# Patient Record
Sex: Male | Born: 1995 | Race: Black or African American | Hispanic: No | Marital: Single
Health system: Southern US, Community
[De-identification: ages and names within clinical notes are randomized; demographics above are authoritative.]

---
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
## 2017-02-20 ENCOUNTER — Encounter (HOSPITAL_COMMUNITY): Payer: Self-pay | Admitting: *Deleted

## 2017-02-20 ENCOUNTER — Other Ambulatory Visit: Payer: Self-pay

## 2017-02-20 DIAGNOSIS — R51 Headache: Secondary | ICD-10-CM | POA: Insufficient documentation

## 2017-02-20 NOTE — ED Triage Notes (Signed)
The pt has had a headache for 4 days with n and v  He has not taken nay medicine for his headache for the past 24 hours

## 2017-02-21 ENCOUNTER — Emergency Department (HOSPITAL_COMMUNITY)
Admission: EM | Admit: 2017-02-21 | Discharge: 2017-02-21 | Disposition: A | Discharge status: Home / Self Care | Payer: Self-pay | Attending: Emergency Medicine | Admitting: Emergency Medicine

## 2017-02-21 DIAGNOSIS — R519 Headache, unspecified: Secondary | ICD-10-CM

## 2017-02-21 DIAGNOSIS — R51 Headache: Secondary | ICD-10-CM

## 2017-02-21 MED ORDER — KETOROLAC TROMETHAMINE 60 MG/2ML IM SOLN
60.0000 mg | Freq: Once | INTRAMUSCULAR | Status: AC
  Administered 2017-02-21: 60 mg via INTRAMUSCULAR
  Filled 2017-02-21: qty 2

## 2017-02-21 NOTE — ED Provider Notes (Signed)
[facility] Provider Note   CSN: [id number] Arrival date & time: 02/20/17  2252     History   Chief Complaint Chief Complaint  Patient presents with  . Headache    HPI Tymel Randa Evensdwards is a 21 y.o. male.  The history is provided by the patient and medical records.  Headache      21 year old male presenting to the ED with headache.  Reports he has had diffuse, throbbing headache for the past 2 days.  He reports associated nausea but denies vomiting.  States he has been somewhat sensitive to light.  He denies any dizziness, numbness, weakness, confusion, changes in speech, or trouble walking.  He has no known history of migraines.  He denies any falls or head trauma.  Not currently on anticoagulation.  He has not taken any medications for his symptoms prior to arrival.  History reviewed. No pertinent past medical history.  There are no active problems to display for this patient.   History reviewed. No pertinent surgical history.     Home Medications    Prior to Admission medications   Medication Sig Start Date End Date Taking? Authorizing Provider  acetaminophen (TYLENOL) 325 MG tablet Take 650 mg by mouth every 6 (six) hours as needed for headache.     [provider]  ibuprofen (ADVIL,MOTRIN) 200 MG tablet Take 200 mg by mouth every 6 (six) hours as needed for headache.     [provider]  ondansetron (ZOFRAN ODT) 8 MG disintegrating tablet Take 1 tablet (8 mg total) by mouth every 8 (eight) hours as needed for nausea or vomiting. 05/23/15   Azalia Bilisampos, Kevin, MD    Family History No family history on file.  Social History Social History   Tobacco Use  . Smoking status: Never Smoker  . Smokeless tobacco: Never Used  Substance Use Topics  . Alcohol use: No  . Drug use: No     Allergies   Patient has no known allergies.   Review of Systems Review of Systems  Neurological: Positive for headaches.  All  other systems reviewed and are negative.    Physical Exam Updated Vital Signs BP 124/75   Pulse 83   Temp 97.7 F (36.5 C) (Oral)   Resp 16   Ht 5\' 11"  (1.803 m)   Wt 86.2 kg (190 lb)   SpO2 94%   BMI 26.50 kg/m   Physical Exam  Constitutional: He is oriented to person, place, and time. He appears well-developed and well-nourished. No distress.  HENT:  Head: Normocephalic and atraumatic.  Right Ear: External ear normal.  Left Ear: External ear normal.  Eyes: Conjunctivae and EOM are normal. Pupils are equal, round, and reactive to light.  Neck: Normal range of motion and full passive range of motion without pain. Neck supple. No neck rigidity.  No rigidity, no meningismus  Cardiovascular: Normal rate, regular rhythm and normal heart sounds.  No murmur heard. Pulmonary/Chest: Effort normal and breath sounds normal. No respiratory distress. He has no wheezes. He has no rhonchi.  Abdominal: Soft. Bowel sounds are normal. There is no tenderness. There is no guarding.  Musculoskeletal: Normal range of motion. He exhibits no edema.  Neurological: He is alert and oriented to person, place, and time. He has normal strength. He displays no tremor. No cranial nerve deficit or sensory deficit. He displays no seizure activity.  AAOx3, answering questions and following commands appropriately; equal strength UE and LE bilaterally; CN grossly intact;  moves all extremities appropriately without ataxia; no focal neuro deficits or facial asymmetry appreciated  Skin: Skin is warm and dry. No rash noted. He is not diaphoretic.  Psychiatric: He has a normal mood and affect. His behavior is normal. Thought content normal.  Nursing note and vitals reviewed.    ED Treatments / Results  Labs (all labs ordered are listed, but only abnormal results are displayed) Labs Reviewed - No data to display  EKG  EKG Interpretation None       Radiology No results found.  Procedures Procedures  (including critical care time)  Medications Ordered in ED Medications  ketorolac (TORADOL) injection 60 mg (60 mg Intramuscular Given 02/21/17 0256)     Initial Impression / Assessment and Plan / ED Course  I have reviewed the triage vital signs and the nursing notes.  Pertinent labs & imaging results that were available during my care of the patient were reviewed by me and considered in my medical decision making (see chart for details).  21 year old male here with headache.  Diffuse in nature with associated photophobia and nausea.  No vomiting, fever, chills, neck pain.  Patient AAOx3 here.  NAD.  Neurologic exam non-focal.  No signs/symptoms concerning for meningitis.  Will give dose of IM toradol.  Headache 2/10 after toradol.  Has been resting comfortably.  Remains neurologically intact.  Patient seems appropriate for discharge.  Close follow-up with PCP encouraged.  Discussed plan with patient, he acknowledged understanding and agreed with plan of care.  Return precautions given for new or worsening symptoms.  Final Clinical Impressions(s) / ED Diagnoses   Final diagnoses:  Bad headache    ED Discharge Orders    None       Garlon HatchetSanders, Lisa M, PA-C 02/21/17 0504    Azalia Bilisampos, Kevin, MD 02/21/17 [phone]

## 2017-02-21 NOTE — Discharge Instructions (Signed)
Follow-up with your primary care doctor. Return here for any new/worsening symptoms.

## 2017-03-17 ENCOUNTER — Encounter (HOSPITAL_COMMUNITY): Payer: Self-pay

## 2017-03-17 ENCOUNTER — Ambulatory Visit (HOSPITAL_COMMUNITY)
Admission: EM | Admit: 2017-03-17 | Discharge: 2017-03-17 | Disposition: A | Discharge status: Home / Self Care | Payer: Self-pay | Attending: Family Medicine | Admitting: Family Medicine

## 2017-03-17 ENCOUNTER — Other Ambulatory Visit: Payer: Self-pay

## 2017-03-17 DIAGNOSIS — R112 Nausea with vomiting, unspecified: Secondary | ICD-10-CM

## 2017-03-17 MED ORDER — ONDANSETRON 8 MG PO TBDP: 8.0000 mg | ORAL_TABLET | Freq: Three times a day (TID) | ORAL | 0 refills | Status: AC | PRN

## 2017-03-17 NOTE — Discharge Instructions (Signed)
Please do your best to ensure adequate fluid intake in order to avoid dehydration. If you find that you are unable to tolerate drinking fluids regularly please proceed to the Emergency Department for evaluation. ° ° °

## 2017-03-17 NOTE — ED Triage Notes (Signed)
[facility], pt states has vomited at least 5 times and vomit is blue in appearance along with blood, pt has not taken any medication

## 2017-03-22 NOTE — ED Provider Notes (Signed)
  [facility]   [id number] 03/17/17 Arrival Time: 1746  ASSESSMENT & PLAN:  1. Non-intractable vomiting with nausea, unspecified vomiting type     Meds ordered this encounter  Medications  . ondansetron (ZOFRAN ODT) 8 MG disintegrating tablet    Sig: Take 1 tablet (8 mg total) by mouth every 8 (eight) hours as needed for nausea or vomiting.    Dispense:  12 tablet    Refill:  0   Likely early viral illness. Close observation. Will f/u if worsening or unable to tolerate PO intake.  Reviewed expectations re: course of current medical issues. Questions answered. Outlined signs and symptoms indicating need for more acute intervention. Patient verbalized understanding. After Visit Summary given.   SUBJECTIVE:  Ronald Pickerelndrell Ciolino is a 22 y.o. male who presents with complaint of emesis. Abrupt onset early this morning. Decreased PO intake. No hematemesis. Afebrile. No diarrhea. No OTC treatment. Last bowel movement yesterday without blood. No sick contacts. No recent travel. No abdominal pain. History reviewed. No pertinent surgical history.  ROS: As per HPI.  OBJECTIVE:  Vitals:   03/17/17 1844  BP: (!) 121/104  Pulse: 97  Resp: 17  Temp: 98.2 F (36.8 C)  TempSrc: Oral  SpO2: 99%    General appearance: alert; no distress Lungs: clear to auscultation bilaterally Heart: regular rate and rhythm Abdomen: soft; non-distended; no tenderness; bowel sounds present; no masses or organomegaly; no guarding or rebound tenderness Back: no CVA tenderness; FROM at hips Extremities: no edema; symmetrical with no gross deformities Skin: warm and dry Psychological: alert and cooperative; normal mood and affect   No Known Allergies                                              Social History   Socioeconomic History  . Marital status: Single    Spouse name: Not on file  . Number of children: Not on file  . Years of education: Not on file  . Highest education level: Not on  file  Social Needs  . Financial resource strain: Not on file  . Food insecurity - worry: Not on file  . Food insecurity - inability: Not on file  . Transportation needs - medical: Not on file  . Transportation needs - non-medical: Not on file  Occupational History  . Not on file  Tobacco Use  . Smoking status: Never Smoker  . Smokeless tobacco: Never Used  Substance and Sexual Activity  . Alcohol use: No  . Drug use: No  . Sexual activity: Not on file  Other Topics Concern  . Not on file  Social History Narrative  . Not on file      Mardella LaymanHagler, Omer Puccinelli, MD 03/22/17 [phone]

## 2017-10-12 IMAGING — DX DG CHEST 2V
2 series · 2 of 2 positions shown · non-contrast
Comparison: None.

CLINICAL DATA: Fever

EXAM:
CHEST  2 VIEW

[chest pa]
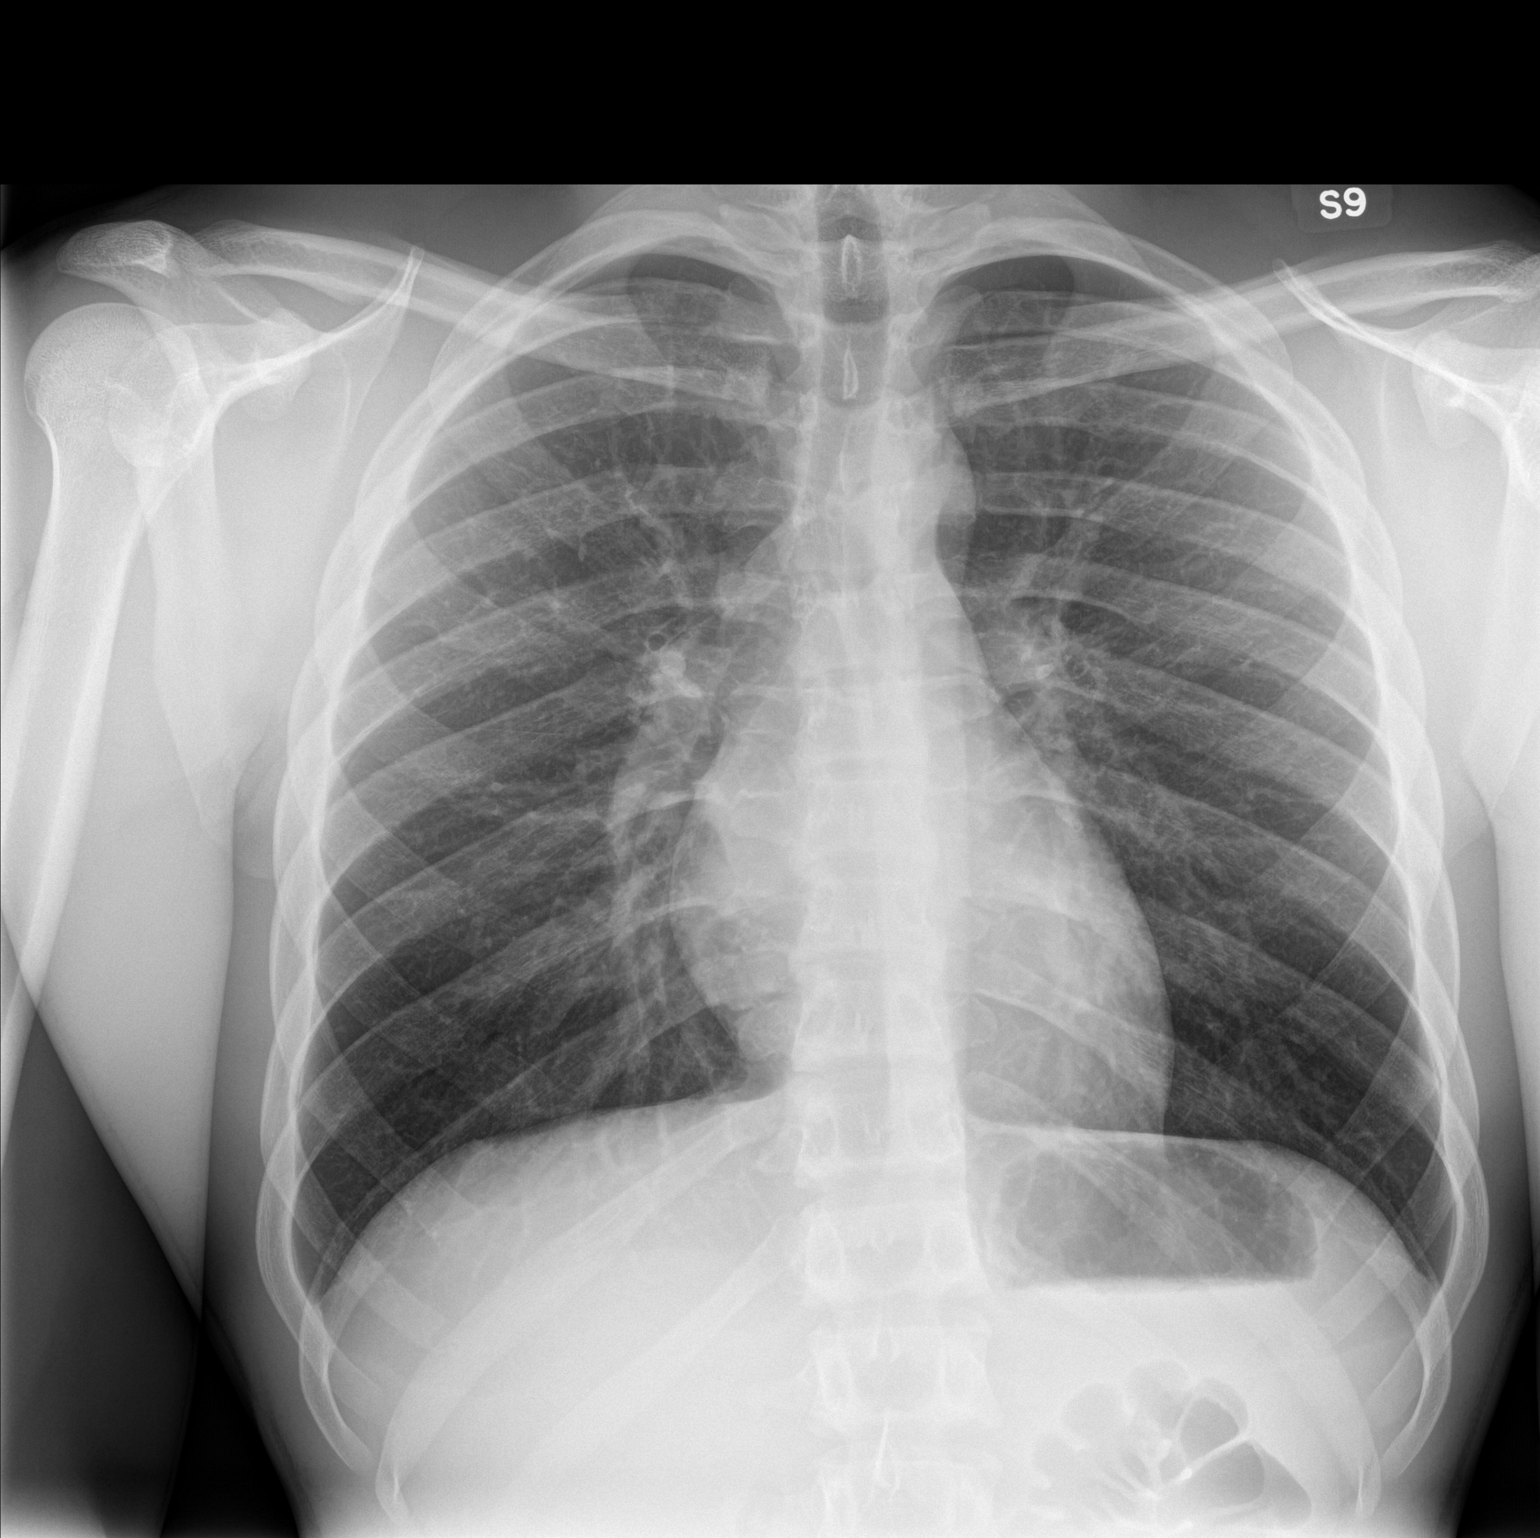

[chest lat]
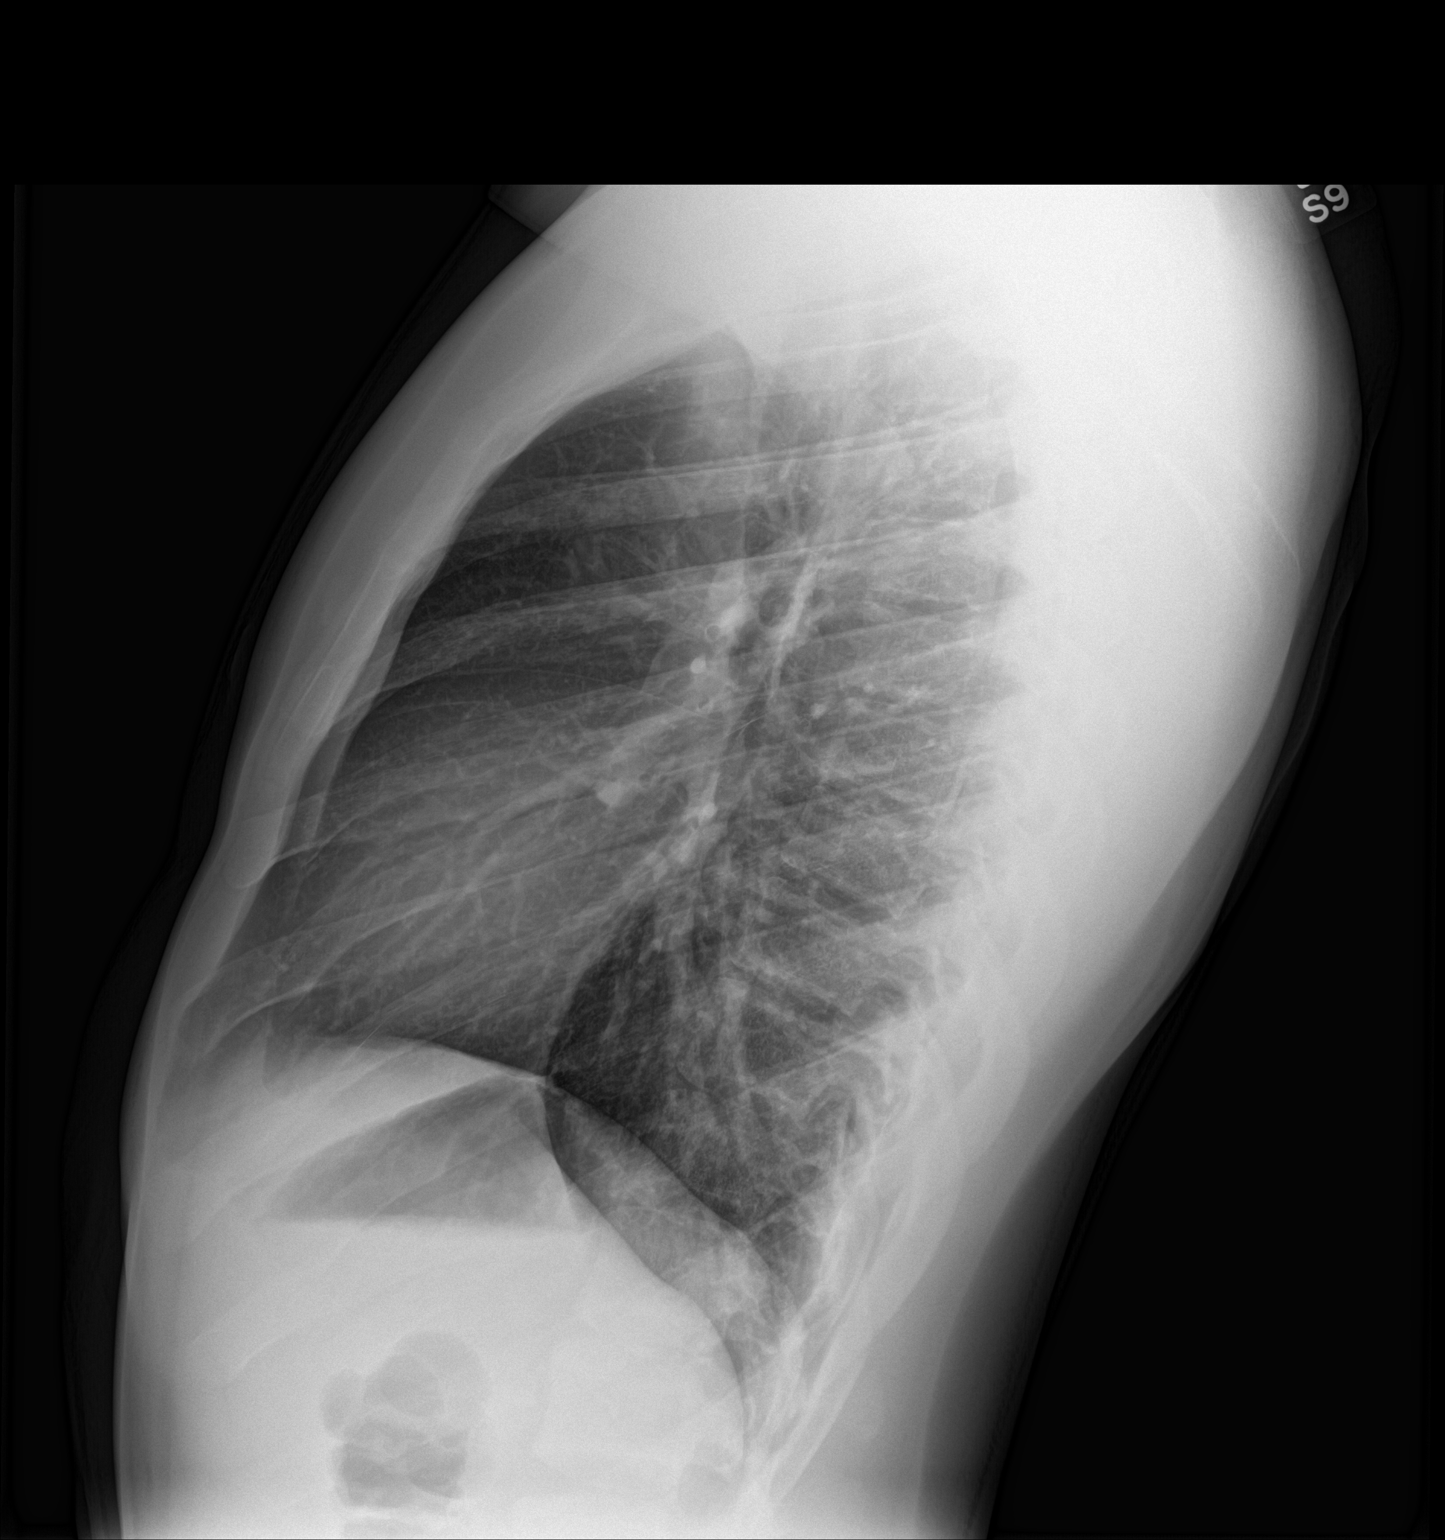

[2 of 2 positions shown; findings below may reference images not displayed]

FINDINGS: Normal heart size. Normal mediastinal contour. No pneumothorax. No
pleural effusion. Lungs appear clear, with no acute consolidative
airspace disease and no pulmonary edema.
IMPRESSION: No active cardiopulmonary disease.

## 2023-06-03 ENCOUNTER — Ambulatory Visit (HOSPITAL_COMMUNITY)
Admission: EM | Admit: 2023-06-03 | Discharge: 2023-06-03 | Disposition: A | Discharge status: Home / Self Care | Attending: Emergency Medicine | Admitting: Emergency Medicine

## 2023-06-03 ENCOUNTER — Encounter (HOSPITAL_COMMUNITY): Payer: Self-pay | Admitting: *Deleted

## 2023-06-03 DIAGNOSIS — Z202 Contact with and (suspected) exposure to infections with a predominantly sexual mode of transmission: Secondary | ICD-10-CM | POA: Insufficient documentation

## 2023-06-03 LAB — HIV ANTIBODY (ROUTINE TESTING W REFLEX): HIV Screen 4th Generation wRfx: NONREACTIVE

## 2023-06-03 MED ORDER — CEFTRIAXONE SODIUM 500 MG IJ SOLR
500.0000 mg | INTRAMUSCULAR | Status: DC
  Administered 2023-06-03: 500 mg via INTRAMUSCULAR

## 2023-06-03 MED ORDER — LIDOCAINE HCL (PF) 1 % IJ SOLN
INTRAMUSCULAR | Status: AC
  Filled 2023-06-03: qty 2

## 2023-06-03 MED ORDER — CEFTRIAXONE SODIUM 500 MG IJ SOLR
INTRAMUSCULAR | Status: AC
  Filled 2023-06-03: qty 500

## 2023-06-03 NOTE — ED Provider Notes (Signed)
 [facility]    CSN: [id number] Arrival date & time: 06/03/23  8295      History   Chief Complaint Chief Complaint  Patient presents with   SEXUALLY TRANSMITTED DISEASE    HPI Ronald Price is a 28 y.o. male.   Presents to clinic requesting sexually-transmitted infection screening.  He was notified this morning that a sexual partner tested positive for gonorrhea.  He was last sexually active with this partner 3 days ago and did not use a condom or barrier method.  He has never had an STI before and denies any symptoms or other known exposures.  Denies penile discharge, dysuria or penile discomfort.  Denies any penile sores or lesions.  The history is provided by the patient and medical records.    History reviewed. No pertinent past medical history.  There are no active problems to display for this patient.   History reviewed. No pertinent surgical history.     Home Medications    Prior to Admission medications   Medication Sig Start Date End Date Taking? Authorizing Provider  acetaminophen (TYLENOL) 325 MG tablet Take 650 mg by mouth every 6 (six) hours as needed for headache.     [provider]  ibuprofen (ADVIL,MOTRIN) 200 MG tablet Take 200 mg by mouth every 6 (six) hours as needed for headache.     [provider]  ondansetron (ZOFRAN ODT) 8 MG disintegrating tablet Take 1 tablet (8 mg total) by mouth every 8 (eight) hours as needed for nausea or vomiting. 03/17/17   Mardella Layman, MD    Family History History reviewed. No pertinent family history.  Social History Social History   Tobacco Use   Smoking status: Never   Smokeless tobacco: Never  Vaping Use   Vaping status: Never Used  Substance Use Topics   Alcohol use: No   Drug use: No     Allergies   Patient has no known allergies.   Review of Systems Review of Systems  Per HPI  Physical Exam Triage Vital Signs ED Triage Vitals  Encounter Vitals Group     BP  06/03/23 0937 123/69     Systolic BP Percentile --      Diastolic BP Percentile --      Pulse Rate 06/03/23 0937 67     Resp 06/03/23 0937 18     Temp 06/03/23 0937 (!) 97.4 F (36.3 C)     Temp Source 06/03/23 0937 Oral     SpO2 06/03/23 0937 96 %     Weight --      Height --      Head Circumference --      Peak Flow --      Pain Score 06/03/23 0936 0     Pain Loc --      Pain Education --      Exclude from Growth Chart --    No data found.  Updated Vital Signs BP 123/69 (BP Location: Left Arm)   Pulse 67   Temp (!) 97.4 F (36.3 C) (Oral)   Resp 18   SpO2 96%   Visual Acuity Right Eye Distance:   Left Eye Distance:   Bilateral Distance:    Right Eye Near:   Left Eye Near:    Bilateral Near:     Physical Exam Vitals and nursing note reviewed.  Constitutional:      Appearance: Normal appearance.  HENT:     Head: Normocephalic and atraumatic.  Right Ear: External ear normal.     Left Ear: External ear normal.     Nose: Nose normal.     Mouth/Throat:     Mouth: Mucous membranes are moist.  Eyes:     Conjunctiva/sclera: Conjunctivae normal.  Cardiovascular:     Rate and Rhythm: Normal rate.  Pulmonary:     Effort: Pulmonary effort is normal. No respiratory distress.  Neurological:     General: No focal deficit present.     Mental Status: He is alert.  Psychiatric:        Mood and Affect: Mood normal.      UC Treatments / Results  Labs (all labs ordered are listed, but only abnormal results are displayed) Labs Reviewed  RPR  HIV ANTIBODY (ROUTINE TESTING W REFLEX)  CYTOLOGY, (ORAL, ANAL, URETHRAL) ANCILLARY ONLY    EKG   Radiology No results found.  Procedures Procedures (including critical care time)  Medications Ordered in UC Medications  cefTRIAXone (ROCEPHIN) injection 500 mg (has no administration in time range)    Initial Impression / Assessment and Plan / UC Course  I have reviewed the triage vital signs and the nursing  notes.  Pertinent labs & imaging results that were available during my care of the patient were reviewed by me and considered in my medical decision making (see chart for details).  Vitals and triage reviewed, patient is hemodynamically stable.  Asymptomatic STI screening, would like HIV and syphilis screening as well.  Exposure to gonorrhea 3 days ago, will treat empirically in clinic with IM Rocephin.  Staff will contact if follow-up or additional treatment is needed.  Plan of care, follow-up care return precautions given, no questions at this time.    Final Clinical Impressions(s) / UC Diagnoses   Final diagnoses:  Exposure to sexually transmitted disease (STD)     Discharge Instructions      Due to your exposure to gonorrhea, we have treated you in clinic with a intramuscular injection of antibiotics.  Abstain from intercourse for the next 7 days and until all results from sexually-transmitted infection screening have been received.  Our staff will contact you if any additional treatment is required.  Return to clinic for any new or urgent symptoms.     ED Prescriptions   None    PDMP not reviewed this encounter.   Davarion Cuffee, [place], [place] 06/03/23 1000

## 2023-06-03 NOTE — Discharge Instructions (Addendum)
 Due to your exposure to gonorrhea, we have treated you in clinic with a intramuscular injection of antibiotics.  Abstain from intercourse for the next 7 days and until all results from sexually-transmitted infection screening have been received.  Our staff will contact you if any additional treatment is required.  Return to clinic for any new or urgent symptoms.

## 2023-06-03 NOTE — ED Triage Notes (Signed)
 Pt states he would like to be tested for STI his partner tested positive for gonorrhea. He denies any sx. He would like cyto and blood work.

## 2023-06-04 LAB — RPR: RPR Ser Ql: NONREACTIVE

## 2023-06-06 LAB — CYTOLOGY, (ORAL, ANAL, URETHRAL) ANCILLARY ONLY
Chlamydia: NEGATIVE
Comment: NEGATIVE
Comment: NEGATIVE
Comment: NORMAL
Neisseria Gonorrhea: NEGATIVE
Trichomonas: NEGATIVE

## 2023-08-29 ENCOUNTER — Emergency Department (HOSPITAL_COMMUNITY): Payer: Self-pay | Admitting: Certified Registered Nurse Anesthetist

## 2023-08-29 ENCOUNTER — Inpatient Hospital Stay (HOSPITAL_COMMUNITY)
Admission: EM | Admit: 2023-08-29 | Discharge: 2023-09-06 | DRG: 957 | Disposition: A | Discharge status: Home / Self Care | Payer: Self-pay | Attending: General Surgery | Admitting: General Surgery

## 2023-08-29 ENCOUNTER — Encounter (HOSPITAL_COMMUNITY): Admission: EM | Disposition: A | Discharge status: Home / Self Care | Payer: Self-pay | Source: Home / Self Care

## 2023-08-29 ENCOUNTER — Other Ambulatory Visit: Payer: Self-pay

## 2023-08-29 ENCOUNTER — Emergency Department (HOSPITAL_COMMUNITY): Payer: Self-pay

## 2023-08-29 DIAGNOSIS — K567 Ileus, unspecified: Secondary | ICD-10-CM | POA: Diagnosis not present

## 2023-08-29 DIAGNOSIS — Z23 Encounter for immunization: Secondary | ICD-10-CM

## 2023-08-29 DIAGNOSIS — D62 Acute posthemorrhagic anemia: Secondary | ICD-10-CM | POA: Diagnosis present

## 2023-08-29 DIAGNOSIS — Z9889 Other specified postprocedural states: Principal | ICD-10-CM

## 2023-08-29 DIAGNOSIS — S36116A Major laceration of liver, initial encounter: Secondary | ICD-10-CM | POA: Diagnosis present

## 2023-08-29 DIAGNOSIS — D696 Thrombocytopenia, unspecified: Secondary | ICD-10-CM | POA: Diagnosis present

## 2023-08-29 DIAGNOSIS — S36113A Laceration of liver, unspecified degree, initial encounter: Secondary | ICD-10-CM

## 2023-08-29 DIAGNOSIS — S27808A Other injury of diaphragm, initial encounter: Secondary | ICD-10-CM | POA: Diagnosis present

## 2023-08-29 DIAGNOSIS — E872 Acidosis, unspecified: Secondary | ICD-10-CM | POA: Diagnosis present

## 2023-08-29 DIAGNOSIS — S27331A Laceration of lung, unilateral, initial encounter: Secondary | ICD-10-CM | POA: Diagnosis present

## 2023-08-29 DIAGNOSIS — J942 Hemothorax: Secondary | ICD-10-CM

## 2023-08-29 DIAGNOSIS — R578 Other shock: Secondary | ICD-10-CM | POA: Diagnosis present

## 2023-08-29 DIAGNOSIS — S21441A Puncture wound with foreign body of right back wall of thorax with penetration into thoracic cavity, initial encounter: Secondary | ICD-10-CM | POA: Diagnosis present

## 2023-08-29 DIAGNOSIS — S51831A Puncture wound without foreign body of right forearm, initial encounter: Secondary | ICD-10-CM | POA: Diagnosis present

## 2023-08-29 DIAGNOSIS — E876 Hypokalemia: Secondary | ICD-10-CM | POA: Diagnosis not present

## 2023-08-29 DIAGNOSIS — S2231XB Fracture of one rib, right side, initial encounter for open fracture: Secondary | ICD-10-CM | POA: Diagnosis present

## 2023-08-29 DIAGNOSIS — J9601 Acute respiratory failure with hypoxia: Secondary | ICD-10-CM | POA: Diagnosis present

## 2023-08-29 DIAGNOSIS — S27321A Contusion of lung, unilateral, initial encounter: Secondary | ICD-10-CM | POA: Diagnosis present

## 2023-08-29 DIAGNOSIS — S31139A Puncture wound of abdominal wall without foreign body, unspecified quadrant without penetration into peritoneal cavity, initial encounter: Secondary | ICD-10-CM

## 2023-08-29 DIAGNOSIS — S27809A Unspecified injury of diaphragm, initial encounter: Secondary | ICD-10-CM | POA: Diagnosis not present

## 2023-08-29 DIAGNOSIS — W3400XA Accidental discharge from unspecified firearms or gun, initial encounter: Secondary | ICD-10-CM | POA: Diagnosis not present

## 2023-08-29 DIAGNOSIS — S31630A Puncture wound without foreign body of abdominal wall, right upper quadrant with penetration into peritoneal cavity, initial encounter: Secondary | ICD-10-CM | POA: Diagnosis present

## 2023-08-29 DIAGNOSIS — S271XXA Traumatic hemothorax, initial encounter: Secondary | ICD-10-CM | POA: Diagnosis present

## 2023-08-29 DIAGNOSIS — S31130A Puncture wound of abdominal wall without foreign body, right upper quadrant without penetration into peritoneal cavity, initial encounter: Secondary | ICD-10-CM | POA: Diagnosis present

## 2023-08-29 HISTORY — PX: LIVER REPAIR: SHX6734

## 2023-08-29 HISTORY — PX: LAPAROTOMY: SHX154

## 2023-08-29 HISTORY — PX: CLOSURE OF DIAPHRAGM: SHX5777

## 2023-08-29 LAB — I-STAT VENOUS BLOOD GAS, ED
Acid-base deficit: 20 mmol/L — ABNORMAL HIGH (ref 0.0–2.0)
Bicarbonate: 7.8 mmol/L — ABNORMAL LOW (ref 20.0–28.0)
Calcium, Ion: 1 mmol/L — ABNORMAL LOW (ref 1.15–1.40)
HCT: 41 % (ref 39.0–52.0)
Hemoglobin: 13.9 g/dL (ref 13.0–17.0)
O2 Saturation: 99 %
Potassium: 4.1 mmol/L (ref 3.5–5.1)
Sodium: 145 mmol/L (ref 135–145)
TCO2: 9 mmol/L — ABNORMAL LOW (ref 22–32)
pCO2, Ven: 24.8 mmHg — ABNORMAL LOW (ref 44–60)
pH, Ven: 7.106 — CL (ref 7.25–7.43)
pO2, Ven: 170 mmHg — ABNORMAL HIGH (ref 32–45)

## 2023-08-29 LAB — I-STAT CG4 LACTIC ACID, ED: Lactic Acid, Venous: >15 mmol/L (ref 0.5–1.9)

## 2023-08-29 LAB — MASSIVE TRANSFUSION PROTOCOL ORDER (BLOOD BANK NOTIFICATION)

## 2023-08-29 LAB — DIC (DISSEMINATED INTRAVASCULAR COAGULATION)PANEL
D-Dimer, Quant: 3.98 ug{FEU}/mL — ABNORMAL HIGH (ref 0.00–0.50)
Fibrinogen: 188 mg/dL — ABNORMAL LOW (ref 210–475)
INR: 1.4 — ABNORMAL HIGH (ref 0.8–1.2)
Platelets: 150 10*3/uL (ref 150–400)
Prothrombin Time: 17.5 s — ABNORMAL HIGH (ref 11.4–15.2)
Smear Review: NONE SEEN
aPTT: 29 s (ref 24–36)

## 2023-08-29 LAB — HEMOGLOBIN AND HEMATOCRIT, BLOOD
HCT: 35.9 % — ABNORMAL LOW (ref 39.0–52.0)
Hemoglobin: 11.5 g/dL — ABNORMAL LOW (ref 13.0–17.0)

## 2023-08-29 SURGERY — LAPAROTOMY, EXPLORATORY
Anesthesia: General | Site: Abdomen

## 2023-08-29 MED ORDER — SODIUM CHLORIDE 0.9 % IV SOLN
INTRAVENOUS | Status: DC | PRN
Start: 2023-08-29 — End: 2023-08-30

## 2023-08-29 MED ORDER — FENTANYL CITRATE (PF) 250 MCG/5ML IJ SOLN
INTRAMUSCULAR | Status: AC
Start: 2023-08-29 — End: 2023-08-29
  Filled 2023-08-29: qty 5

## 2023-08-29 MED ORDER — KETAMINE HCL 50 MG/5ML IJ SOSY
PREFILLED_SYRINGE | INTRAMUSCULAR | Status: AC
  Filled 2023-08-29: qty 5

## 2023-08-29 MED ORDER — SUCCINYLCHOLINE CHLORIDE 200 MG/10ML IV SOSY
PREFILLED_SYRINGE | INTRAVENOUS | Status: DC | PRN
  Administered 2023-08-29: 140 mg via INTRAVENOUS

## 2023-08-29 MED ORDER — 0.9 % SODIUM CHLORIDE (POUR BTL) OPTIME
TOPICAL | Status: DC | PRN
  Administered 2023-08-29: 4000 mL

## 2023-08-29 MED ORDER — TETANUS-DIPHTH-ACELL PERTUSSIS 5-2.5-18.5 LF-MCG/0.5 IM SUSY
0.5000 mL | PREFILLED_SYRINGE | Freq: Once | INTRAMUSCULAR | Status: DC
  Administered 2023-08-29: 0.5 mL via INTRAMUSCULAR

## 2023-08-29 MED ORDER — TRANEXAMIC ACID-NACL 1000-0.7 MG/100ML-% IV SOLN: 1000.0000 mg | Freq: Once | INTRAVENOUS | Status: DC

## 2023-08-29 MED ORDER — MIDAZOLAM HCL 2 MG/2ML IJ SOLN
INTRAMUSCULAR | Status: AC
  Filled 2023-08-29: qty 2

## 2023-08-29 MED ORDER — LIDOCAINE-EPINEPHRINE (PF) 2 %-1:200000 IJ SOLN
INTRAMUSCULAR | Status: AC
  Filled 2023-08-29: qty 20

## 2023-08-29 MED ORDER — ALBUMIN HUMAN 5 % IV SOLN: INTRAVENOUS | Status: DC | PRN

## 2023-08-29 MED ORDER — CEFAZOLIN SODIUM-DEXTROSE 2-4 GM/100ML-% IV SOLN
2.0000 g | Freq: Once | INTRAVENOUS | Status: AC
  Administered 2023-08-29: 2 g via INTRAVENOUS

## 2023-08-29 MED ORDER — FENTANYL CITRATE PF 50 MCG/ML IJ SOSY
PREFILLED_SYRINGE | INTRAMUSCULAR | Status: AC
  Administered 2023-08-29: 100 ug
  Filled 2023-08-29: qty 2

## 2023-08-29 MED ORDER — PHENYLEPHRINE HCL-NACL 20-0.9 MG/250ML-% IV SOLN: INTRAVENOUS | Status: DC | PRN

## 2023-08-29 MED ORDER — MIDAZOLAM HCL 2 MG/2ML IJ SOLN
INTRAMUSCULAR | Status: DC | PRN
  Administered 2023-08-29: 2 mg via INTRAVENOUS

## 2023-08-29 MED ORDER — PROPOFOL 10 MG/ML IV BOLUS
INTRAVENOUS | Status: DC | PRN
  Administered 2023-08-29: 150 mg via INTRAVENOUS

## 2023-08-29 MED ORDER — PHENYLEPHRINE HCL-NACL 20-0.9 MG/250ML-% IV SOLN
INTRAVENOUS | Status: DC | PRN
  Administered 2023-08-29: 50 ug/min via INTRAVENOUS

## 2023-08-29 MED ORDER — ROCURONIUM BROMIDE 10 MG/ML (PF) SYRINGE
PREFILLED_SYRINGE | INTRAVENOUS | Status: DC | PRN
  Administered 2023-08-29: 100 mg via INTRAVENOUS
  Administered 2023-08-29: 50 mg via INTRAVENOUS

## 2023-08-29 MED ORDER — TRANEXAMIC ACID 1000 MG/10ML IV SOLN
1000.0000 mg | Freq: Once | INTRAVENOUS | Status: AC
  Administered 2023-08-29: 1000 mg via INTRAVENOUS
  Filled 2023-08-29: qty 10

## 2023-08-29 MED ORDER — KETAMINE HCL 10 MG/ML IJ SOLN
INTRAMUSCULAR | Status: DC | PRN
  Administered 2023-08-29: 20 mg via INTRAVENOUS
  Administered 2023-08-29: 30 mg via INTRAVENOUS

## 2023-08-29 MED ORDER — LIDOCAINE 2% (20 MG/ML) 5 ML SYRINGE
INTRAMUSCULAR | Status: DC | PRN
  Administered 2023-08-29: 80 mg via INTRAVENOUS

## 2023-08-29 MED ORDER — PROPOFOL 500 MG/50ML IV EMUL
INTRAVENOUS | Status: DC | PRN
Start: 2023-08-29 — End: 2023-08-30
  Administered 2023-08-29: 50 ug/kg/min via INTRAVENOUS

## 2023-08-29 MED ORDER — FENTANYL CITRATE (PF) 250 MCG/5ML IJ SOLN
INTRAMUSCULAR | Status: DC | PRN
  Administered 2023-08-29: 100 ug via INTRAVENOUS
  Administered 2023-08-29: 50 ug via INTRAVENOUS
  Administered 2023-08-30: 100 ug via INTRAVENOUS

## 2023-08-29 SURGICAL SUPPLY — 37 items
BAG COUNTER SPONGE SURGICOUNT (BAG) ×3 IMPLANT
CANISTER SUCTION 3000ML PPV (SUCTIONS) ×3 IMPLANT
CHLORAPREP W/TINT 26 (MISCELLANEOUS) ×3 IMPLANT
COVER SURGICAL LIGHT HANDLE (MISCELLANEOUS) ×3 IMPLANT
DRAIN CHANNEL 19F RND (DRAIN) IMPLANT
DRAPE LAPAROSCOPIC ABDOMINAL (DRAPES) ×3 IMPLANT
DRAPE WARM FLUID 44X44 (DRAPES) ×3 IMPLANT
DRSG OPSITE POSTOP 4X10 (GAUZE/BANDAGES/DRESSINGS) IMPLANT
DRSG OPSITE POSTOP 4X12 (GAUZE/BANDAGES/DRESSINGS) IMPLANT
DRSG OPSITE POSTOP 4X8 (GAUZE/BANDAGES/DRESSINGS) IMPLANT
ELECT CAUTERY BLADE 6.4 (BLADE) ×3 IMPLANT
ELECTRODE REM PT RTRN 9FT ADLT (ELECTROSURGICAL) ×3 IMPLANT
EVACUATOR SILICONE 100CC (DRAIN) IMPLANT
GLOVE BIO SURGEON STRL SZ 6 (GLOVE) ×3 IMPLANT
GLOVE BIOGEL PI IND STRL 7.0 (GLOVE) IMPLANT
GLOVE INDICATOR 6.5 STRL GRN (GLOVE) ×3 IMPLANT
GLOVE SURG SS PI 7.0 STRL IVOR (GLOVE) IMPLANT
GOWN STRL REUS W/ TWL LRG LVL3 (GOWN DISPOSABLE) ×6 IMPLANT
HANDLE SUCTION POOLE (INSTRUMENTS) ×3 IMPLANT
HEMOSTAT SNOW SURGICEL 2X4 (HEMOSTASIS) IMPLANT
KIT BASIN OR (CUSTOM PROCEDURE TRAY) ×3 IMPLANT
KIT TURNOVER KIT B (KITS) ×3 IMPLANT
NS IRRIG 1000ML POUR BTL (IV SOLUTION) ×6 IMPLANT
PACK GENERAL/GYN (CUSTOM PROCEDURE TRAY) ×3 IMPLANT
PAD ARMBOARD POSITIONER FOAM (MISCELLANEOUS) ×3 IMPLANT
PENCIL SMOKE EVACUATOR (MISCELLANEOUS) ×3 IMPLANT
STAPLER SKIN PROX 35W (STAPLE) ×3 IMPLANT
SUT CHROMIC 0 CT 1 36 (SUTURE) IMPLANT
SUT ETHILON 3 0 PS 1 (SUTURE) IMPLANT
SUT PDS AB 1 TP1 96 (SUTURE) ×6 IMPLANT
SUT SILK 2 0 SH CR/8 (SUTURE) ×3 IMPLANT
SUT SILK 2 0 TIES 10X30 (SUTURE) ×3 IMPLANT
SUT SILK 3 0 SH CR/8 (SUTURE) ×3 IMPLANT
SUT SILK 3 0 TIES 10X30 (SUTURE) ×3 IMPLANT
SUT VIC AB 1 CT1 27XBRD ANTBC (SUTURE) IMPLANT
TOWEL GREEN STERILE (TOWEL DISPOSABLE) ×3 IMPLANT
TRAY FOLEY MTR SLVR 16FR STAT (SET/KITS/TRAYS/PACK) IMPLANT
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
## 2023-08-29 NOTE — OR Nursing (Signed)
 No family present, emergency consent per Dr. Signe

## 2023-08-29 NOTE — Procedures (Signed)
 Chest Tube Insertion Procedure Note  Indications:  Clinically significant Hemothorax  Pre-operative Diagnosis: Hemothorax  Post-operative Diagnosis: Hemothorax  Procedure Details  Informed consent was obtained for the procedure, including sedation.  Risks of lung perforation, hemorrhage, arrhythmia, and adverse drug reaction were discussed.   After sterile skin prep, using standard technique, a 28 French tube was placed in the right lateral 5th rib space.  Findings: Large volume hemothorax  Estimated Blood Loss:  Minimal         Specimens:  None              Complications:  None; patient tolerated the procedure well.         Disposition: OR         Condition: unstable  Attending Attestation: I performed the procedure.

## 2023-08-29 NOTE — ED Notes (Signed)
 Thompson (mother) [phone]

## 2023-08-29 NOTE — ED Notes (Incomplete)
 2135: Pt arrived to
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
## 2023-08-29 NOTE — Anesthesia Procedure Notes (Signed)
 Procedure Name: Intubation Date/Time: 08/29/2023 10:26 PM  Performed by: Jex Strausbaugh T, CRNAPre-anesthesia Checklist: Patient identified, Emergency Drugs available, Suction available and Patient being monitored Patient Re-evaluated:Patient Re-evaluated prior to induction Oxygen Delivery Method: Circle system utilized Preoxygenation: Pre-oxygenation with 100% oxygen Induction Type: IV induction, Rapid sequence and Cricoid Pressure applied Ventilation: Mask ventilation without difficulty Laryngoscope Size: Miller and 3 Grade View: Grade II Tube type: Oral Tube size: 7.5 mm Number of attempts: 1 Airway Equipment and Method: Stylet and Oral airway Placement Confirmation: ETT inserted through vocal cords under direct vision, positive ETCO2 and breath sounds checked- equal and bilateral Secured at: 23 cm Tube secured with: Tape Dental Injury: Teeth and Oropharynx as per pre-operative assessment

## 2023-08-29 NOTE — ED Notes (Addendum)
 Pts mother Thompson updated on pt status and is on the way to the hospital.
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
## 2023-08-29 NOTE — Anesthesia Procedure Notes (Signed)
 Arterial Line Insertion Start/End6/23/2025 10:25 AM, 08/29/2023 10:27 AM Performed by: Dorethea Cordella SQUIBB, DO, Mannie Krystal LABOR, CRNA  Patient location: OR. Emergency situation Left, radial was placed Catheter size: 20 G  Attempts: 1 Procedure performed without using ultrasound guided technique. Following insertion, Biopatch. Post procedure assessment: normal  Patient tolerated the procedure well with no immediate complications.
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
## 2023-08-30 ENCOUNTER — Inpatient Hospital Stay (HOSPITAL_COMMUNITY)

## 2023-08-30 ENCOUNTER — Encounter (HOSPITAL_COMMUNITY): Payer: Self-pay | Admitting: Surgery

## 2023-08-30 DIAGNOSIS — S27809A Unspecified injury of diaphragm, initial encounter: Secondary | ICD-10-CM

## 2023-08-30 DIAGNOSIS — W3400XA Accidental discharge from unspecified firearms or gun, initial encounter: Secondary | ICD-10-CM

## 2023-08-30 LAB — TYPE AND SCREEN
ABO/RH(D): A POS
Antibody Screen: NEGATIVE
Unit division: 0
Unit division: 0
Unit division: 0
Unit division: 0
Unit division: 0
Unit division: 0
Unit division: 0
Unit division: 0
Unit division: 0
Unit division: 0

## 2023-08-30 LAB — PREPARE FRESH FROZEN PLASMA
Unit division: 0
Unit division: 0
Unit division: 0
Unit division: 0

## 2023-08-30 LAB — ABO/RH: ABO/RH(D): A POS

## 2023-08-30 LAB — HIV ANTIBODY (ROUTINE TESTING W REFLEX): HIV Screen 4th Generation wRfx: NONREACTIVE

## 2023-08-30 LAB — MRSA NEXT GEN BY PCR, NASAL: MRSA by PCR Next Gen: NOT DETECTED

## 2023-08-30 LAB — BPAM FFP
Blood Product Expiration Date: 202506282359
Blood Product Expiration Date: 202506282359
Blood Product Expiration Date: 202506282359
Blood Product Expiration Date: 202506282359
ISSUE DATE / TIME: 202506232230
ISSUE DATE / TIME: 202506232230
ISSUE DATE / TIME: 202506241122
ISSUE DATE / TIME: 202506241122
Unit Type and Rh: 600
Unit Type and Rh: 6200
Unit Type and Rh: 6200
Unit Type and Rh: 6200

## 2023-08-30 LAB — BPAM RBC
Blood Product Expiration Date: 202507052359
Blood Product Expiration Date: 202507052359
Blood Product Expiration Date: 202507182359
Blood Product Expiration Date: 202507182359
Blood Product Expiration Date: 202507182359
Blood Product Expiration Date: 202507182359
Blood Product Expiration Date: 202507182359
Blood Product Expiration Date: 202507182359
Blood Product Expiration Date: 202507182359
Blood Product Expiration Date: 202507182359
ISSUE DATE / TIME: 202506232139
ISSUE DATE / TIME: 202506232139
ISSUE DATE / TIME: 202506232226
ISSUE DATE / TIME: 202506232226
ISSUE DATE / TIME: 202506241042
ISSUE DATE / TIME: 202506241042
ISSUE DATE / TIME: 202506241048
ISSUE DATE / TIME: 202506241048
ISSUE DATE / TIME: 202506241048
ISSUE DATE / TIME: 202506241048
Unit Type and Rh: 5100
Unit Type and Rh: 5100
Unit Type and Rh: 5100
Unit Type and Rh: 5100
Unit Type and Rh: 5100
Unit Type and Rh: 5100
Unit Type and Rh: 5100
Unit Type and Rh: 5100
Unit Type and Rh: 5100
Unit Type and Rh: 5100

## 2023-08-30 LAB — CBC
HCT: 33.6 % — ABNORMAL LOW (ref 39.0–52.0)
HCT: 33.8 % — ABNORMAL LOW (ref 39.0–52.0)
HCT: 35.2 % — ABNORMAL LOW (ref 39.0–52.0)
HCT: 35.4 % — ABNORMAL LOW (ref 39.0–52.0)
Hemoglobin: 11.4 g/dL — ABNORMAL LOW (ref 13.0–17.0)
Hemoglobin: 11.5 g/dL — ABNORMAL LOW (ref 13.0–17.0)
Hemoglobin: 11.9 g/dL — ABNORMAL LOW (ref 13.0–17.0)
Hemoglobin: 12 g/dL — ABNORMAL LOW (ref 13.0–17.0)
MCH: 30 pg (ref 26.0–34.0)
MCH: 30.2 pg (ref 26.0–34.0)
MCH: 30.2 pg (ref 26.0–34.0)
MCH: 30.7 pg (ref 26.0–34.0)
MCHC: 33.6 g/dL (ref 30.0–36.0)
MCHC: 33.9 g/dL (ref 30.0–36.0)
MCHC: 34 g/dL (ref 30.0–36.0)
MCHC: 34.1 g/dL (ref 30.0–36.0)
MCV: 88.7 fL (ref 80.0–100.0)
MCV: 89.1 fL (ref 80.0–100.0)
MCV: 89.2 fL (ref 80.0–100.0)
MCV: 90 fL (ref 80.0–100.0)
Platelets: 104 10*3/uL — ABNORMAL LOW (ref 150–400)
Platelets: 91 10*3/uL — ABNORMAL LOW (ref 150–400)
Platelets: 92 10*3/uL — ABNORMAL LOW (ref 150–400)
Platelets: 98 10*3/uL — ABNORMAL LOW (ref 150–400)
RBC: 3.77 MIL/uL — ABNORMAL LOW (ref 4.22–5.81)
RBC: 3.81 MIL/uL — ABNORMAL LOW (ref 4.22–5.81)
RBC: 3.91 MIL/uL — ABNORMAL LOW (ref 4.22–5.81)
RBC: 3.97 MIL/uL — ABNORMAL LOW (ref 4.22–5.81)
RDW: 15.1 % (ref 11.5–15.5)
RDW: 15.7 % — ABNORMAL HIGH (ref 11.5–15.5)
RDW: 15.9 % — ABNORMAL HIGH (ref 11.5–15.5)
RDW: 16 % — ABNORMAL HIGH (ref 11.5–15.5)
WBC: 10.1 10*3/uL (ref 4.0–10.5)
WBC: 10.4 10*3/uL (ref 4.0–10.5)
WBC: 10.6 10*3/uL — ABNORMAL HIGH (ref 4.0–10.5)
WBC: 9.9 10*3/uL (ref 4.0–10.5)
nRBC: 0 % (ref 0.0–0.2)
nRBC: 0 % (ref 0.0–0.2)
nRBC: 0 % (ref 0.0–0.2)
nRBC: 0 % (ref 0.0–0.2)

## 2023-08-30 LAB — HEPATIC FUNCTION PANEL
ALT: 252 U/L — ABNORMAL HIGH (ref 0–44)
AST: 262 U/L — ABNORMAL HIGH (ref 15–41)
Albumin: 3 g/dL — ABNORMAL LOW (ref 3.5–5.0)
Alkaline Phosphatase: 37 U/L — ABNORMAL LOW (ref 38–126)
Bilirubin, Direct: 0.3 mg/dL — ABNORMAL HIGH (ref 0.0–0.2)
Indirect Bilirubin: 1.1 mg/dL — ABNORMAL HIGH (ref 0.3–0.9)
Total Bilirubin: 1.4 mg/dL — ABNORMAL HIGH (ref 0.0–1.2)
Total Protein: 5.4 g/dL — ABNORMAL LOW (ref 6.5–8.1)

## 2023-08-30 LAB — LACTIC ACID, PLASMA: Lactic Acid, Venous: 1.5 mmol/L (ref 0.5–1.9)

## 2023-08-30 LAB — POCT I-STAT 7, (LYTES, BLD GAS, ICA,H+H)
Acid-base deficit: 3 mmol/L — ABNORMAL HIGH (ref 0.0–2.0)
Acid-base deficit: 6 mmol/L — ABNORMAL HIGH (ref 0.0–2.0)
Acid-base deficit: 8 mmol/L — ABNORMAL HIGH (ref 0.0–2.0)
Bicarbonate: 18.2 mmol/L — ABNORMAL LOW (ref 20.0–28.0)
Bicarbonate: 19.4 mmol/L — ABNORMAL LOW (ref 20.0–28.0)
Bicarbonate: 21.6 mmol/L (ref 20.0–28.0)
Calcium, Ion: 0.92 mmol/L — ABNORMAL LOW (ref 1.15–1.40)
Calcium, Ion: 1.07 mmol/L — ABNORMAL LOW (ref 1.15–1.40)
Calcium, Ion: 1.07 mmol/L — ABNORMAL LOW (ref 1.15–1.40)
HCT: 24 % — ABNORMAL LOW (ref 39.0–52.0)
HCT: 27 % — ABNORMAL LOW (ref 39.0–52.0)
HCT: 33 % — ABNORMAL LOW (ref 39.0–52.0)
Hemoglobin: 11.2 g/dL — ABNORMAL LOW (ref 13.0–17.0)
Hemoglobin: 8.2 g/dL — ABNORMAL LOW (ref 13.0–17.0)
Hemoglobin: 9.2 g/dL — ABNORMAL LOW (ref 13.0–17.0)
O2 Saturation: 100 %
O2 Saturation: 100 %
O2 Saturation: 100 %
Patient temperature: 34.7
Patient temperature: 35
Patient temperature: 35.1
Potassium: 3.5 mmol/L (ref 3.5–5.1)
Potassium: 3.7 mmol/L (ref 3.5–5.1)
Potassium: 3.9 mmol/L (ref 3.5–5.1)
Sodium: 143 mmol/L (ref 135–145)
Sodium: 146 mmol/L — ABNORMAL HIGH (ref 135–145)
Sodium: 146 mmol/L — ABNORMAL HIGH (ref 135–145)
TCO2: 19 mmol/L — ABNORMAL LOW (ref 22–32)
TCO2: 20 mmol/L — ABNORMAL LOW (ref 22–32)
TCO2: 23 mmol/L (ref 22–32)
pCO2 arterial: 32.7 mmHg (ref 32–48)
pCO2 arterial: 33 mmHg (ref 32–48)
pCO2 arterial: 36.4 mmHg (ref 32–48)
pH, Arterial: 7.298 — ABNORMAL LOW (ref 7.35–7.45)
pH, Arterial: 7.371 (ref 7.35–7.45)
pH, Arterial: 7.415 (ref 7.35–7.45)
pO2, Arterial: 213 mmHg — ABNORMAL HIGH (ref 83–108)
pO2, Arterial: 281 mmHg — ABNORMAL HIGH (ref 83–108)
pO2, Arterial: 369 mmHg — ABNORMAL HIGH (ref 83–108)

## 2023-08-30 LAB — BASIC METABOLIC PANEL WITH GFR
Anion gap: 8 (ref 5–15)
BUN: 7 mg/dL (ref 6–20)
CO2: 19 mmol/L — ABNORMAL LOW (ref 22–32)
Calcium: 7 mg/dL — ABNORMAL LOW (ref 8.9–10.3)
Chloride: 114 mmol/L — ABNORMAL HIGH (ref 98–111)
Creatinine, Ser: 1.03 mg/dL (ref 0.61–1.24)
GFR, Estimated: >60 mL/min (ref >60)
Glucose, Bld: 106 mg/dL — ABNORMAL HIGH (ref 70–99)
Potassium: 3.6 mmol/L (ref 3.5–5.1)
Sodium: 141 mmol/L (ref 135–145)

## 2023-08-30 LAB — POCT I-STAT CREATININE: Creatinine, Ser: 0.9 mg/dL (ref 0.61–1.24)

## 2023-08-30 LAB — TRIGLYCERIDES: Triglycerides: 48 mg/dL (ref <150)

## 2023-08-30 LAB — CK: Total CK: 510 U/L — ABNORMAL HIGH (ref 49–397)

## 2023-08-30 LAB — GLUCOSE, CAPILLARY: Glucose-Capillary: 114 mg/dL — ABNORMAL HIGH (ref 70–99)

## 2023-08-30 MED ORDER — EPHEDRINE 5 MG/ML INJ
INTRAVENOUS | Status: AC
Start: 2023-08-30 — End: 2023-08-30
  Filled 2023-08-30: qty 5

## 2023-08-30 MED ORDER — ROCURONIUM BROMIDE 10 MG/ML (PF) SYRINGE
PREFILLED_SYRINGE | INTRAVENOUS | Status: AC
Start: 2023-08-30 — End: 2023-08-30
  Filled 2023-08-30: qty 20

## 2023-08-30 MED ORDER — DOCUSATE SODIUM 100 MG PO CAPS
100.0000 mg | ORAL_CAPSULE | Freq: Two times a day (BID) | ORAL | Status: DC
Start: 2023-08-30 — End: 2023-08-30

## 2023-08-30 MED ORDER — IOHEXOL 350 MG/ML SOLN
75.0000 mL | Freq: Once | INTRAVENOUS | Status: AC | PRN
Start: 2023-08-30 — End: 2023-08-30
  Administered 2023-08-30: 75 mL via INTRAVENOUS

## 2023-08-30 MED ORDER — POLYETHYLENE GLYCOL 3350 17 G PO PACK
17.0000 g | PACK | Freq: Every day | ORAL | Status: DC | PRN
Start: 2023-08-30 — End: 2023-08-30
  Administered 2023-08-30: 17 g
  Filled 2023-08-30: qty 1

## 2023-08-30 MED ORDER — CALCIUM GLUCONATE-NACL 2-0.675 GM/100ML-% IV SOLN
2.0000 g | Freq: Once | INTRAVENOUS | Status: AC
  Administered 2023-08-30: 2000 mg via INTRAVENOUS
  Filled 2023-08-30: qty 100

## 2023-08-30 MED ORDER — METHOCARBAMOL 1000 MG/10ML IJ SOLN
500.0000 mg | Freq: Three times a day (TID) | INTRAMUSCULAR | Status: DC
  Administered 2023-08-30: 500 mg via INTRAVENOUS
  Filled 2023-08-30: qty 10

## 2023-08-30 MED ORDER — MORPHINE SULFATE (PF) 2 MG/ML IV SOLN
2.0000 mg | INTRAVENOUS | Status: DC | PRN
  Administered 2023-08-31 – 2023-09-01 (×3): 2 mg via INTRAVENOUS
  Administered 2023-09-01 – 2023-09-02 (×3): 4 mg via INTRAVENOUS
  Administered 2023-09-02: 2 mg via INTRAVENOUS
  Administered 2023-09-04 – 2023-09-05 (×2): 4 mg via INTRAVENOUS
  Filled 2023-08-30 (×2): qty 2
  Filled 2023-08-30: qty 1
  Filled 2023-08-30 (×2): qty 2
  Filled 2023-08-30 (×2): qty 1
  Filled 2023-08-30: qty 2
  Filled 2023-08-30 (×2): qty 1

## 2023-08-30 MED ORDER — ORAL CARE MOUTH RINSE
15.0000 mL | OROMUCOSAL | Status: DC
  Administered 2023-08-30 (×7): 15 mL via OROMUCOSAL

## 2023-08-30 MED ORDER — ACETAMINOPHEN 500 MG PO TABS
1000.0000 mg | ORAL_TABLET | Freq: Four times a day (QID) | ORAL | Status: DC
  Administered 2023-08-30 – 2023-08-31 (×6): 1000 mg
  Filled 2023-08-30 (×7): qty 2

## 2023-08-30 MED ORDER — ONDANSETRON HCL 4 MG/2ML IJ SOLN
4.0000 mg | Freq: Four times a day (QID) | INTRAMUSCULAR | Status: DC | PRN
  Administered 2023-08-30 – 2023-09-01 (×4): 4 mg via INTRAVENOUS
  Filled 2023-08-30 (×4): qty 2

## 2023-08-30 MED ORDER — OXYCODONE HCL 5 MG PO TABS
5.0000 mg | ORAL_TABLET | ORAL | Status: DC | PRN
  Administered 2023-08-30: 10 mg
  Filled 2023-08-30 (×2): qty 2

## 2023-08-30 MED ORDER — MIDAZOLAM HCL 2 MG/2ML IJ SOLN: 1.0000 mg | INTRAMUSCULAR | Status: DC | PRN

## 2023-08-30 MED ORDER — ONDANSETRON 4 MG PO TBDP
4.0000 mg | ORAL_TABLET | Freq: Four times a day (QID) | ORAL | Status: DC | PRN
  Administered 2023-09-02: 4 mg via ORAL
  Filled 2023-08-30 (×2): qty 1

## 2023-08-30 MED ORDER — DOCUSATE SODIUM 50 MG/5ML PO LIQD
100.0000 mg | Freq: Two times a day (BID) | ORAL | Status: DC
  Administered 2023-08-30: 100 mg
  Filled 2023-08-30: qty 10

## 2023-08-30 MED ORDER — CHLORHEXIDINE GLUCONATE CLOTH 2 % EX PADS
6.0000 | MEDICATED_PAD | Freq: Every day | CUTANEOUS | Status: DC
  Administered 2023-08-30 – 2023-09-06 (×8): 6 via TOPICAL

## 2023-08-30 MED ORDER — FENTANYL CITRATE PF 50 MCG/ML IJ SOSY: 25.0000 ug | PREFILLED_SYRINGE | Freq: Once | INTRAMUSCULAR | Status: DC

## 2023-08-30 MED ORDER — OXYCODONE HCL 5 MG PO TABS
5.0000 mg | ORAL_TABLET | ORAL | Status: DC | PRN
  Administered 2023-08-30: 10 mg via ORAL
  Administered 2023-08-30: 5 mg via ORAL
  Administered 2023-08-31 – 2023-09-01 (×6): 10 mg via ORAL
  Administered 2023-09-04: 5 mg via ORAL
  Administered 2023-09-05 (×2): 10 mg via ORAL
  Filled 2023-08-30: qty 1
  Filled 2023-08-30 (×4): qty 2
  Filled 2023-08-30: qty 1
  Filled 2023-08-30 (×4): qty 2
  Filled 2023-08-30: qty 1

## 2023-08-30 MED ORDER — METHOCARBAMOL 500 MG PO TABS
500.0000 mg | ORAL_TABLET | Freq: Three times a day (TID) | ORAL | Status: AC
  Administered 2023-08-30 – 2023-09-01 (×6): 500 mg via ORAL
  Filled 2023-08-30 (×6): qty 1

## 2023-08-30 MED ORDER — HYDRALAZINE HCL 20 MG/ML IJ SOLN: 10.0000 mg | INTRAMUSCULAR | Status: DC | PRN

## 2023-08-30 MED ORDER — DEXMEDETOMIDINE HCL IN NACL 400 MCG/100ML IV SOLN
0.0000 ug/kg/h | INTRAVENOUS | Status: DC
  Administered 2023-08-30: 1.2 ug/kg/h via INTRAVENOUS
  Administered 2023-08-30: 1 ug/kg/h via INTRAVENOUS
  Administered 2023-08-30: 0.4 ug/kg/h via INTRAVENOUS
  Filled 2023-08-30: qty 200
  Filled 2023-08-30: qty 100

## 2023-08-30 MED ORDER — POLYETHYLENE GLYCOL 3350 17 G PO PACK: 17.0000 g | PACK | Freq: Every day | ORAL | Status: DC | PRN

## 2023-08-30 MED ORDER — LIDOCAINE 2% (20 MG/ML) 5 ML SYRINGE
INTRAMUSCULAR | Status: AC
  Filled 2023-08-30: qty 5

## 2023-08-30 MED ORDER — SODIUM CHLORIDE 0.9 % IV SOLN: INTRAVENOUS | Status: AC

## 2023-08-30 MED ORDER — CHLORHEXIDINE GLUCONATE CLOTH 2 % EX PADS
6.0000 | MEDICATED_PAD | Freq: Every day | CUTANEOUS | Status: DC
  Administered 2023-08-30: 6 via TOPICAL

## 2023-08-30 MED ORDER — FENTANYL 2500MCG IN NS 250ML (10MCG/ML) PREMIX INFUSION
0.0000 ug/h | INTRAVENOUS | Status: DC
  Administered 2023-08-30: 25 ug/h via INTRAVENOUS
  Filled 2023-08-30: qty 250

## 2023-08-30 MED ORDER — METOPROLOL TARTRATE 5 MG/5ML IV SOLN: 5.0000 mg | Freq: Four times a day (QID) | INTRAVENOUS | Status: DC | PRN

## 2023-08-30 MED ORDER — PROPOFOL 1000 MG/100ML IV EMUL
0.0000 ug/kg/min | INTRAVENOUS | Status: DC
  Filled 2023-08-30: qty 200

## 2023-08-30 MED ORDER — METHOCARBAMOL 500 MG PO TABS: 500.0000 mg | ORAL_TABLET | Freq: Three times a day (TID) | ORAL | Status: DC

## 2023-08-30 MED ORDER — METHOCARBAMOL 500 MG PO TABS
500.0000 mg | ORAL_TABLET | Freq: Three times a day (TID) | ORAL | Status: DC
  Administered 2023-08-30: 500 mg via ORAL
  Filled 2023-08-30: qty 1

## 2023-08-30 MED ORDER — DOCUSATE SODIUM 50 MG/5ML PO LIQD
100.0000 mg | Freq: Two times a day (BID) | ORAL | Status: DC
  Filled 2023-08-30 (×2): qty 10

## 2023-08-30 MED ORDER — ORAL CARE MOUTH RINSE
15.0000 mL | OROMUCOSAL | Status: DC | PRN
Start: 2023-08-30 — End: 2023-09-06

## 2023-08-30 MED ORDER — ORAL CARE MOUTH RINSE: 15.0000 mL | OROMUCOSAL | Status: DC | PRN

## 2023-08-30 MED ORDER — METHOCARBAMOL 1000 MG/10ML IJ SOLN: 500.0000 mg | Freq: Three times a day (TID) | INTRAMUSCULAR | Status: DC

## 2023-08-30 MED ORDER — FENTANYL BOLUS VIA INFUSION: 25.0000 ug | INTRAVENOUS | Status: DC | PRN

## 2023-08-30 MED ORDER — ONDANSETRON HCL 4 MG/2ML IJ SOLN
INTRAMUSCULAR | Status: AC
  Filled 2023-08-30: qty 2

## 2023-08-30 MED ORDER — METHOCARBAMOL 1000 MG/10ML IJ SOLN
500.0000 mg | Freq: Three times a day (TID) | INTRAMUSCULAR | Status: AC
  Administered 2023-09-01: 500 mg via INTRAVENOUS
  Filled 2023-08-30: qty 10

## 2023-08-30 MED ORDER — PHENYLEPHRINE 80 MCG/ML (10ML) SYRINGE FOR IV PUSH (FOR BLOOD PRESSURE SUPPORT)
PREFILLED_SYRINGE | INTRAVENOUS | Status: AC
  Filled 2023-08-30: qty 40

## 2023-08-30 MED ORDER — SUCCINYLCHOLINE CHLORIDE 200 MG/10ML IV SOSY
PREFILLED_SYRINGE | INTRAVENOUS | Status: AC
  Filled 2023-08-30: qty 20

## 2023-08-30 NOTE — Progress Notes (Addendum)
 Patient was transported to CT & back to room 4N29 on the ventilator with no problems.

## 2023-08-30 NOTE — Transfer of Care (Signed)
 Immediate Anesthesia Transfer of Care Note  Patient: Ronald Price  Procedure(s) Performed: LAPAROTOMY, EXPLORATORY (Abdomen) DIAPHRAGM REPAIR REPAIR LIVER LACERATION (Abdomen)  Patient Location: ICU  Anesthesia Type:General  Level of Consciousness: Patient remains intubated per anesthesia plan  Airway & Oxygen Therapy: Patient remains intubated per anesthesia plan and Patient placed on Ventilator (see vital sign flow sheet for setting)  Post-op Assessment: Report given to RN and Post -op Vital signs reviewed and stable  Post vital signs: Reviewed and stable  Last Vitals:  Vitals Value Taken Time  BP 146/87 08/30/23 00:15  Temp    Pulse 55 08/30/23 00:22  Resp 18 08/30/23 00:23  SpO2 100 % 08/30/23 00:22  Vitals shown include unfiled device data.  Last Pain:  Vitals:   08/29/23 2145  TempSrc:   PainSc: 10-Worst pain ever         Complications: No notable events documented.

## 2023-08-30 NOTE — Anesthesia Postprocedure Evaluation (Signed)
 Anesthesia Post Note  Patient: Claudell Bohr  Procedure(s) Performed: LAPAROTOMY, EXPLORATORY (Abdomen) DIAPHRAGM REPAIR REPAIR LIVER LACERATION (Abdomen)     Patient location during evaluation: SICU Anesthesia Type: General Level of consciousness: sedated Pain management: pain level controlled Vital Signs Assessment: post-procedure vital signs reviewed and stable Respiratory status: patient remains intubated per anesthesia plan Cardiovascular status: stable Postop Assessment: no apparent nausea or vomiting Anesthetic complications: no   No notable events documented.  Last Vitals:  Vitals:   08/30/23 0645 08/30/23 0700  BP: 116/75 129/71  Pulse: 68 70  Resp:    Temp: 36.8 C 36.8 C  SpO2: 100% 100%    Last Pain:  Vitals:   08/29/23 2145  TempSrc:   PainSc: 10-Worst pain ever                 Cordella P Luisfernando Brightwell

## 2023-08-30 NOTE — Progress Notes (Addendum)
 CT o/p had slowed-130cc from 7a to noon, but noted to be 210cc in the last 1.5h. CT chest reviewed and without significant retained HTX. TCTS consulted, Dr. Lucas.   Dreama GEANNIE Hanger, MD General and Trauma Surgery [facility]

## 2023-08-30 NOTE — Progress Notes (Signed)
 Transition of Care [facility]) - CAGE-AID Screening   Patient Details  Name: Ronald Price MRN: [id number] Date of Birth: [birth year]   MARINDA LIONEL Sora, RN Phone Number: 08/30/2023, 6:36 AM   Clinical Narrative:  Pt intubated and sedated unable to participate  CAGE-AID Screening: Substance Abuse Screening unable to be completed due to: : Patient unable to participate

## 2023-08-30 NOTE — Progress Notes (Signed)
 RT NOTE: RT transported PT on ventilator from room 4N29 to CT and back to room 4N29 with no complications.

## 2023-08-30 NOTE — Progress Notes (Signed)
 Visited with pt. Mother at bedside.  Other wanted to visit and mother said no. She said they were not who they say they were. Chaplain provided information sharing, emotional and spiritual support.  Chaplain available as needed.   Rayleen Dade, [place], [place] C, Pager [phone]

## 2023-08-30 NOTE — Progress Notes (Addendum)
 Dr. Paola notified of increased CT output in last 2 hours, out. Also aware of new crepitus to RUQ of abd, just below JP drain. Previous Crepitus remains unchanged.
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
## 2023-08-30 NOTE — Progress Notes (Signed)
 New Crepitus noted on Right side, above Chest tube. Chest tube still draining.    TRN and Trauma Dr. Signe notified
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
## 2023-08-30 NOTE — Procedures (Signed)
 Extubation Procedure Note  Patient Details:   Name: Ronald Price DOB: 1996-02-17 MRN: [id number]   Airway Documentation:    Vent end date: 08/30/23 Vent end time: 1043   Evaluation  O2 sats: stable throughout Complications: No apparent complications Patient did tolerate procedure well. Bilateral Breath Sounds: Diminished, Clear   Yes  Patient extubated per MD order. Positive cuff leak. No stridor noted. Vitals are stable on 3L [place]. RN at bedside.  Adriahna Shearman H Oscar Hank 08/30/2023, 10:43 AM

## 2023-08-31 ENCOUNTER — Inpatient Hospital Stay (HOSPITAL_COMMUNITY)

## 2023-08-31 LAB — CBC
HCT: 31.2 % — ABNORMAL LOW (ref 39.0–52.0)
Hemoglobin: 10.6 g/dL — ABNORMAL LOW (ref 13.0–17.0)
MCH: 30.5 pg (ref 26.0–34.0)
MCHC: 34 g/dL (ref 30.0–36.0)
MCV: 89.7 fL (ref 80.0–100.0)
Platelets: 87 10*3/uL — ABNORMAL LOW (ref 150–400)
RBC: 3.48 MIL/uL — ABNORMAL LOW (ref 4.22–5.81)
RDW: 15.4 % (ref 11.5–15.5)
WBC: 10.4 10*3/uL (ref 4.0–10.5)
nRBC: 0 % (ref 0.0–0.2)

## 2023-08-31 LAB — BASIC METABOLIC PANEL WITH GFR
Anion gap: 7 (ref 5–15)
BUN: 8 mg/dL (ref 6–20)
CO2: 21 mmol/L — ABNORMAL LOW (ref 22–32)
Calcium: 7.7 mg/dL — ABNORMAL LOW (ref 8.9–10.3)
Chloride: 107 mmol/L (ref 98–111)
Creatinine, Ser: 0.96 mg/dL (ref 0.61–1.24)
GFR, Estimated: >60 mL/min (ref >60)
Glucose, Bld: 105 mg/dL — ABNORMAL HIGH (ref 70–99)
Potassium: 3.2 mmol/L — ABNORMAL LOW (ref 3.5–5.1)
Sodium: 135 mmol/L (ref 135–145)

## 2023-08-31 MED ORDER — ENOXAPARIN SODIUM 30 MG/0.3ML IJ SOSY: 30.0000 mg | PREFILLED_SYRINGE | Freq: Two times a day (BID) | INTRAMUSCULAR | Status: DC

## 2023-08-31 MED ORDER — ACETAMINOPHEN 500 MG PO TABS
1000.0000 mg | ORAL_TABLET | Freq: Four times a day (QID) | ORAL | Status: DC
  Administered 2023-08-31 – 2023-09-06 (×20): 1000 mg via ORAL
  Filled 2023-08-31 (×21): qty 2

## 2023-08-31 MED ORDER — POTASSIUM CHLORIDE 20 MEQ PO PACK
40.0000 meq | PACK | ORAL | Status: AC
  Administered 2023-08-31 (×2): 40 meq via ORAL
  Filled 2023-08-31 (×2): qty 2
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
## 2023-08-31 NOTE — TOC CAGE-AID Note (Signed)
 Transition of Care [facility]) - CAGE-AID Screening  Patient Details  Name: Ronald Price MRN: [id number] Date of Birth: 1995/08/29  Clinical Narrative:  Patient endorses occasional alcohol use. Per patient it's not really my thing. When he drinks, he usually takes a couple of shots. Patient endorses marijuana use but denies all other drug use. Patient denies need for substance abuse resources at this time.  CAGE-AID Screening: Substance Abuse Screening unable to be completed due to: : Patient unable to participate  Have You Ever Felt You Ought to Cut Down on Your Drinking or Drug Use?: No Have People Annoyed You By Critizing Your Drinking Or Drug Use?: No Have You Felt Bad Or Guilty About Your Drinking Or Drug Use?: No Have You Ever Had a Drink or Used Drugs First Thing In The Morning to Steady Your Nerves or to Get Rid of a Hangover?: No CAGE-AID Score: 0  Substance Abuse Education Offered: No

## 2023-08-31 NOTE — Progress Notes (Signed)
     [address]       [place] [address]             [phone]       CT output remains thin, hgb stable Will sign off  Sian Rockers O Khalin Royce
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
## 2023-08-31 NOTE — Plan of Care (Signed)
  Problem: Health Behavior/Discharge Planning: Goal: Ability to manage health-related needs will improve Outcome: Progressing   Problem: Clinical Measurements: Goal: Ability to maintain clinical measurements within normal limits will improve Outcome: Progressing Goal: Respiratory complications will improve Outcome: Progressing Goal: Cardiovascular complication will be avoided Outcome: Progressing   Problem: Nutrition: Goal: Adequate nutrition will be maintained Outcome: Progressing   Problem: Elimination: Goal: Will not experience complications related to urinary retention Outcome: Progressing
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
## 2023-09-01 ENCOUNTER — Inpatient Hospital Stay (HOSPITAL_COMMUNITY)

## 2023-09-01 LAB — CBC
HCT: 32.2 % — ABNORMAL LOW (ref 39.0–52.0)
Hemoglobin: 11.1 g/dL — ABNORMAL LOW (ref 13.0–17.0)
MCH: 30.7 pg (ref 26.0–34.0)
MCHC: 34.5 g/dL (ref 30.0–36.0)
MCV: 89 fL (ref 80.0–100.0)
Platelets: 101 10*3/uL — ABNORMAL LOW (ref 150–400)
RBC: 3.62 MIL/uL — ABNORMAL LOW (ref 4.22–5.81)
RDW: 15 % (ref 11.5–15.5)
WBC: 7.6 10*3/uL (ref 4.0–10.5)
nRBC: 0 % (ref 0.0–0.2)

## 2023-09-01 LAB — BASIC METABOLIC PANEL WITH GFR
Anion gap: 6 (ref 5–15)
BUN: <5 mg/dL — ABNORMAL LOW (ref 6–20)
CO2: 28 mmol/L (ref 22–32)
Calcium: 8.5 mg/dL — ABNORMAL LOW (ref 8.9–10.3)
Chloride: 103 mmol/L (ref 98–111)
Creatinine, Ser: 0.89 mg/dL (ref 0.61–1.24)
GFR, Estimated: >60 mL/min (ref >60)
Glucose, Bld: 110 mg/dL — ABNORMAL HIGH (ref 70–99)
Potassium: 2.8 mmol/L — ABNORMAL LOW (ref 3.5–5.1)
Sodium: 137 mmol/L (ref 135–145)

## 2023-09-01 MED ORDER — DOCUSATE SODIUM 100 MG PO CAPS
100.0000 mg | ORAL_CAPSULE | Freq: Two times a day (BID) | ORAL | Status: DC
  Administered 2023-09-01 – 2023-09-06 (×12): 100 mg via ORAL
  Filled 2023-09-01 (×12): qty 1

## 2023-09-01 MED ORDER — POTASSIUM CHLORIDE 10 MEQ/100ML IV SOLN
10.0000 meq | INTRAVENOUS | Status: AC
  Administered 2023-09-01 (×6): 10 meq via INTRAVENOUS
  Filled 2023-09-01 (×6): qty 100

## 2023-09-01 MED ORDER — ENOXAPARIN SODIUM 30 MG/0.3ML IJ SOSY
30.0000 mg | PREFILLED_SYRINGE | Freq: Two times a day (BID) | INTRAMUSCULAR | Status: DC
  Administered 2023-09-01 – 2023-09-06 (×10): 30 mg via SUBCUTANEOUS
  Filled 2023-09-01 (×10): qty 0.3
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
## 2023-09-01 NOTE — Plan of Care (Signed)
  Problem: Skin Integrity: Goal: Risk for impaired skin integrity will decrease Outcome: Progressing   Problem: Safety: Goal: Ability to remain free from injury will improve Outcome: Progressing   Problem: Pain Managment: Goal: General experience of comfort will improve and/or be controlled Outcome: Progressing   Problem: Coping: Goal: Level of anxiety will decrease Outcome: Progressing
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
## 2023-09-02 ENCOUNTER — Encounter (HOSPITAL_COMMUNITY): Payer: Self-pay

## 2023-09-02 ENCOUNTER — Inpatient Hospital Stay (HOSPITAL_COMMUNITY)

## 2023-09-02 ENCOUNTER — Other Ambulatory Visit: Payer: Self-pay

## 2023-09-02 LAB — CBC
HCT: 32 % — ABNORMAL LOW (ref 39.0–52.0)
Hemoglobin: 10.9 g/dL — ABNORMAL LOW (ref 13.0–17.0)
MCH: 30.4 pg (ref 26.0–34.0)
MCHC: 34.1 g/dL (ref 30.0–36.0)
MCV: 89.4 fL (ref 80.0–100.0)
Platelets: 132 10*3/uL — ABNORMAL LOW (ref 150–400)
RBC: 3.58 MIL/uL — ABNORMAL LOW (ref 4.22–5.81)
RDW: 14.7 % (ref 11.5–15.5)
WBC: 6 10*3/uL (ref 4.0–10.5)
nRBC: 0 % (ref 0.0–0.2)

## 2023-09-02 LAB — BASIC METABOLIC PANEL WITH GFR
Anion gap: 12 (ref 5–15)
BUN: <5 mg/dL — ABNORMAL LOW (ref 6–20)
CO2: 25 mmol/L (ref 22–32)
Calcium: 8.6 mg/dL — ABNORMAL LOW (ref 8.9–10.3)
Chloride: 99 mmol/L (ref 98–111)
Creatinine, Ser: 0.96 mg/dL (ref 0.61–1.24)
GFR, Estimated: >60 mL/min (ref >60)
Glucose, Bld: 85 mg/dL (ref 70–99)
Potassium: 3.2 mmol/L — ABNORMAL LOW (ref 3.5–5.1)
Sodium: 136 mmol/L (ref 135–145)

## 2023-09-02 MED ORDER — POTASSIUM CHLORIDE 10 MEQ/100ML IV SOLN
10.0000 meq | INTRAVENOUS | Status: AC
  Administered 2023-09-02 (×4): 10 meq via INTRAVENOUS
  Filled 2023-09-02 (×3): qty 100

## 2023-09-02 MED ORDER — METHOCARBAMOL 1000 MG/10ML IJ SOLN: 500.0000 mg | Freq: Three times a day (TID) | INTRAMUSCULAR | Status: AC

## 2023-09-02 MED ORDER — METHOCARBAMOL 500 MG PO TABS
500.0000 mg | ORAL_TABLET | Freq: Three times a day (TID) | ORAL | Status: AC
  Administered 2023-09-02 – 2023-09-04 (×7): 500 mg via ORAL
  Filled 2023-09-02 (×7): qty 1

## 2023-09-02 MED ORDER — POLYETHYLENE GLYCOL 3350 17 G PO PACK
17.0000 g | PACK | Freq: Every day | ORAL | Status: DC
  Administered 2023-09-02 – 2023-09-04 (×3): 17 g via ORAL
  Filled 2023-09-02 (×4): qty 1
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
## 2023-09-02 NOTE — Plan of Care (Signed)
  Problem: Elimination: Goal: Will not experience complications related to bowel motility Outcome: Progressing   Problem: Coping: Goal: Level of anxiety will decrease Outcome: Progressing   Problem: Nutrition: Goal: Adequate nutrition will be maintained Outcome: Progressing   Problem: Activity: Goal: Risk for activity intolerance will decrease Outcome: Progressing   Problem: Pain Managment: Goal: General experience of comfort will improve and/or be controlled Outcome: Progressing   Problem: Safety: Goal: Ability to remain free from injury will improve Outcome: Progressing
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
## 2023-09-03 ENCOUNTER — Inpatient Hospital Stay (HOSPITAL_COMMUNITY)

## 2023-09-03 LAB — BASIC METABOLIC PANEL WITH GFR
Anion gap: 10 (ref 5–15)
BUN: <5 mg/dL — ABNORMAL LOW (ref 6–20)
CO2: 27 mmol/L (ref 22–32)
Calcium: 9 mg/dL (ref 8.9–10.3)
Chloride: 102 mmol/L (ref 98–111)
Creatinine, Ser: 0.95 mg/dL (ref 0.61–1.24)
GFR, Estimated: >60 mL/min (ref >60)
Glucose, Bld: 85 mg/dL (ref 70–99)
Potassium: 3.3 mmol/L — ABNORMAL LOW (ref 3.5–5.1)
Sodium: 139 mmol/L (ref 135–145)

## 2023-09-03 LAB — CBC
HCT: 36.5 % — ABNORMAL LOW (ref 39.0–52.0)
Hemoglobin: 12.3 g/dL — ABNORMAL LOW (ref 13.0–17.0)
MCH: 30.2 pg (ref 26.0–34.0)
MCHC: 33.7 g/dL (ref 30.0–36.0)
MCV: 89.7 fL (ref 80.0–100.0)
Platelets: 201 10*3/uL (ref 150–400)
RBC: 4.07 MIL/uL — ABNORMAL LOW (ref 4.22–5.81)
RDW: 14.6 % (ref 11.5–15.5)
WBC: 5.8 10*3/uL (ref 4.0–10.5)
nRBC: 0 % (ref 0.0–0.2)

## 2023-09-03 NOTE — Plan of Care (Signed)
  Problem: Pain Managment: Goal: General experience of comfort will improve and/or be controlled Outcome: Progressing   Problem: Safety: Goal: Ability to remain free from injury will improve Outcome: Progressing

## 2023-09-03 NOTE — Plan of Care (Signed)
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
## 2023-09-04 LAB — BASIC METABOLIC PANEL WITH GFR
Anion gap: 13 (ref 5–15)
BUN: <5 mg/dL — ABNORMAL LOW (ref 6–20)
CO2: 24 mmol/L (ref 22–32)
Calcium: 8.9 mg/dL (ref 8.9–10.3)
Chloride: 104 mmol/L (ref 98–111)
Creatinine, Ser: 0.87 mg/dL (ref 0.61–1.24)
GFR, Estimated: >60 mL/min (ref >60)
Glucose, Bld: 111 mg/dL — ABNORMAL HIGH (ref 70–99)
Potassium: 3.1 mmol/L — ABNORMAL LOW (ref 3.5–5.1)
Sodium: 141 mmol/L (ref 135–145)

## 2023-09-04 LAB — CBC
HCT: 33.9 % — ABNORMAL LOW (ref 39.0–52.0)
Hemoglobin: 11.6 g/dL — ABNORMAL LOW (ref 13.0–17.0)
MCH: 30.6 pg (ref 26.0–34.0)
MCHC: 34.2 g/dL (ref 30.0–36.0)
MCV: 89.4 fL (ref 80.0–100.0)
Platelets: 229 10*3/uL (ref 150–400)
RBC: 3.79 MIL/uL — ABNORMAL LOW (ref 4.22–5.81)
RDW: 14.8 % (ref 11.5–15.5)
WBC: 8.6 10*3/uL (ref 4.0–10.5)
nRBC: 0 % (ref 0.0–0.2)
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
## 2023-09-04 NOTE — Plan of Care (Signed)

## 2023-09-04 NOTE — Plan of Care (Signed)
  Problem: Pain Managment: Goal: General experience of comfort will improve and/or be controlled Outcome: Progressing   Problem: Safety: Goal: Ability to remain free from injury will improve Outcome: Progressing

## 2023-09-05 ENCOUNTER — Inpatient Hospital Stay (HOSPITAL_COMMUNITY)

## 2023-09-05 LAB — CBC
HCT: 34.6 % — ABNORMAL LOW (ref 39.0–52.0)
Hemoglobin: 11.7 g/dL — ABNORMAL LOW (ref 13.0–17.0)
MCH: 30.2 pg (ref 26.0–34.0)
MCHC: 33.8 g/dL (ref 30.0–36.0)
MCV: 89.4 fL (ref 80.0–100.0)
Platelets: 285 10*3/uL (ref 150–400)
RBC: 3.87 MIL/uL — ABNORMAL LOW (ref 4.22–5.81)
RDW: 15 % (ref 11.5–15.5)
WBC: 8.7 10*3/uL (ref 4.0–10.5)
nRBC: 0 % (ref 0.0–0.2)

## 2023-09-05 LAB — BASIC METABOLIC PANEL WITH GFR
Anion gap: 11 (ref 5–15)
BUN: <5 mg/dL — ABNORMAL LOW (ref 6–20)
CO2: 25 mmol/L (ref 22–32)
Calcium: 8.5 mg/dL — ABNORMAL LOW (ref 8.9–10.3)
Chloride: 100 mmol/L (ref 98–111)
Creatinine, Ser: 0.86 mg/dL (ref 0.61–1.24)
GFR, Estimated: >60 mL/min (ref >60)
Glucose, Bld: 103 mg/dL — ABNORMAL HIGH (ref 70–99)
Potassium: 3 mmol/L — ABNORMAL LOW (ref 3.5–5.1)
Sodium: 136 mmol/L (ref 135–145)

## 2023-09-05 LAB — MAGNESIUM: Magnesium: 1.9 mg/dL (ref 1.7–2.4)

## 2023-09-05 MED ORDER — LIDOCAINE 5 % EX PTCH
2.0000 | MEDICATED_PATCH | CUTANEOUS | Status: DC
  Administered 2023-09-05 – 2023-09-06 (×2): 2 via TRANSDERMAL
  Filled 2023-09-05 (×2): qty 2

## 2023-09-05 MED ORDER — HYDROXYZINE HCL 25 MG PO TABS: 25.0000 mg | ORAL_TABLET | Freq: Three times a day (TID) | ORAL | Status: DC | PRN

## 2023-09-05 MED ORDER — METHOCARBAMOL 500 MG PO TABS
1000.0000 mg | ORAL_TABLET | Freq: Three times a day (TID) | ORAL | Status: DC
  Filled 2023-09-05: qty 2

## 2023-09-05 MED ORDER — POLYETHYLENE GLYCOL 3350 17 G PO PACK
17.0000 g | PACK | Freq: Two times a day (BID) | ORAL | Status: DC
  Administered 2023-09-05 – 2023-09-06 (×3): 17 g via ORAL
  Filled 2023-09-05 (×2): qty 1

## 2023-09-05 MED ORDER — MORPHINE SULFATE (PF) 2 MG/ML IV SOLN
INTRAVENOUS | Status: AC
  Filled 2023-09-05: qty 1

## 2023-09-05 MED ORDER — MORPHINE SULFATE (PF) 2 MG/ML IV SOLN
2.0000 mg | INTRAVENOUS | Status: DC | PRN
  Administered 2023-09-05: 2 mg via INTRAVENOUS

## 2023-09-05 MED ORDER — MAGNESIUM HYDROXIDE 400 MG/5ML PO SUSP
30.0000 mL | Freq: Once | ORAL | Status: AC
  Administered 2023-09-05: 30 mL via ORAL
  Filled 2023-09-05 (×2): qty 30

## 2023-09-05 MED ORDER — POTASSIUM CHLORIDE CRYS ER 20 MEQ PO TBCR
40.0000 meq | EXTENDED_RELEASE_TABLET | Freq: Two times a day (BID) | ORAL | Status: DC
  Administered 2023-09-05 – 2023-09-06 (×3): 40 meq via ORAL
  Filled 2023-09-05 (×3): qty 2

## 2023-09-05 MED ORDER — GABAPENTIN 100 MG PO CAPS
100.0000 mg | ORAL_CAPSULE | Freq: Three times a day (TID) | ORAL | Status: DC
  Administered 2023-09-05 – 2023-09-06 (×4): 100 mg via ORAL
  Filled 2023-09-05 (×4): qty 1

## 2023-09-05 MED ORDER — METHOCARBAMOL 1000 MG/10ML IJ SOLN: 500.0000 mg | Freq: Three times a day (TID) | INTRAMUSCULAR | Status: DC

## 2023-09-05 MED ORDER — METHOCARBAMOL 500 MG PO TABS
1000.0000 mg | ORAL_TABLET | Freq: Three times a day (TID) | ORAL | Status: DC
  Administered 2023-09-05 – 2023-09-06 (×4): 1000 mg via ORAL
  Filled 2023-09-05 (×3): qty 2

## 2023-09-05 MED ORDER — IBUPROFEN 800 MG PO TABS
800.0000 mg | ORAL_TABLET | Freq: Three times a day (TID) | ORAL | Status: DC
  Administered 2023-09-05 – 2023-09-06 (×4): 800 mg via ORAL
  Filled 2023-09-05 (×4): qty 1

## 2023-09-05 NOTE — Plan of Care (Signed)
  Problem: Health Behavior/Discharge Planning: Goal: Ability to manage health-related needs will improve Outcome: Progressing   Problem: Clinical Measurements: Goal: Ability to maintain clinical measurements within normal limits will improve Outcome: Progressing   Problem: Activity: Goal: Risk for activity intolerance will decrease Outcome: Progressing   Problem: Coping: Goal: Level of anxiety will decrease Outcome: Progressing   Problem: Pain Managment: Goal: General experience of comfort will improve and/or be controlled Outcome: Progressing
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
## 2023-09-05 NOTE — Progress Notes (Addendum)
 OT Cancellation Note  Patient Details Name: Ronald Price MRN: [id number] DOB: [birth year]   Cancelled Treatment:    Reason Eval/Treat Not Completed: (P) Patient at procedure or test/ unavailable, x-ray, will return later  [name] 09/05/2023, 1:15 PM
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
## 2023-09-05 NOTE — Plan of Care (Signed)
  Problem: Health Behavior/Discharge Planning: Goal: Ability to manage health-related needs will improve Outcome: Progressing   Problem: Pain Managment: Goal: General experience of comfort will improve and/or be controlled Outcome: Progressing   Problem: Safety: Goal: Ability to remain free from injury will improve Outcome: Progressing

## 2023-09-06 ENCOUNTER — Other Ambulatory Visit (HOSPITAL_COMMUNITY): Payer: Self-pay

## 2023-09-06 ENCOUNTER — Inpatient Hospital Stay (HOSPITAL_COMMUNITY)

## 2023-09-06 ENCOUNTER — Encounter (HOSPITAL_COMMUNITY): Payer: Self-pay | Admitting: *Deleted

## 2023-09-06 LAB — POCT I-STAT, CHEM 8
BUN: 11 mg/dL (ref 6–20)
Calcium, Ion: 1.03 mmol/L — ABNORMAL LOW (ref 1.15–1.40)
Chloride: 114 mmol/L — ABNORMAL HIGH (ref 98–111)
Creatinine, Ser: 1.6 mg/dL — ABNORMAL HIGH (ref 0.61–1.24)
Glucose, Bld: 196 mg/dL — ABNORMAL HIGH (ref 70–99)
HCT: 43 % (ref 39.0–52.0)
Hemoglobin: 14.6 g/dL (ref 13.0–17.0)
Potassium: 4 mmol/L (ref 3.5–5.1)
Sodium: 147 mmol/L — ABNORMAL HIGH (ref 135–145)
TCO2: 11 mmol/L — ABNORMAL LOW (ref 22–32)

## 2023-09-06 MED ORDER — IBUPROFEN 800 MG PO TABS
800.0000 mg | ORAL_TABLET | Freq: Three times a day (TID) | ORAL | 0 refills | Status: AC
  Filled 2023-09-06: qty 30, 10d supply, fill #0

## 2023-09-06 MED ORDER — ACETAMINOPHEN 500 MG PO TABS: 1000.0000 mg | ORAL_TABLET | Freq: Four times a day (QID) | ORAL | Status: AC | PRN

## 2023-09-06 MED ORDER — OXYCODONE HCL 5 MG PO TABS
5.0000 mg | ORAL_TABLET | Freq: Four times a day (QID) | ORAL | 0 refills | Status: AC | PRN
  Filled 2023-09-06: qty 20, 3d supply, fill #0

## 2023-09-06 MED ORDER — POLYETHYLENE GLYCOL 3350 17 G PO PACK: 17.0000 g | PACK | Freq: Every day | ORAL | Status: AC | PRN

## 2023-09-06 MED ORDER — METHOCARBAMOL 500 MG PO TABS
1000.0000 mg | ORAL_TABLET | Freq: Three times a day (TID) | ORAL | 0 refills | Status: AC | PRN
  Filled 2023-09-06: qty 60, 10d supply, fill #0

## 2023-09-06 MED ORDER — LIDOCAINE 5 % EX PTCH
2.0000 | MEDICATED_PATCH | CUTANEOUS | 0 refills | Status: AC
  Filled 2023-09-06: qty 30, 15d supply, fill #0

## 2023-09-06 MED ORDER — GABAPENTIN 100 MG PO CAPS
100.0000 mg | ORAL_CAPSULE | Freq: Three times a day (TID) | ORAL | 0 refills | Status: AC
  Filled 2023-09-06: qty 90, 30d supply, fill #0

## 2023-09-06 NOTE — Progress Notes (Signed)
 Explained discharge instructions to patient. Reviewed follow up appointment and next medication administration times. Also reviewed education. Patient verbalized having an understanding for instructions given. All belongings are in the patient's possession to include TOC meds. IV was removed by his nurse.  No other needs verbalized. Transporting downstairs to the discharge lounge to await taxi
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
## 2023-09-06 NOTE — Plan of Care (Signed)

## 2023-09-06 NOTE — Discharge Instructions (Signed)
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
## 2023-09-06 NOTE — Plan of Care (Signed)
  Problem: Education: Goal: Knowledge of General Education information will improve Description: Including pain rating scale, medication(s)/side effects and non-pharmacologic comfort measures Outcome: Adequate for Discharge   Problem: Health Behavior/Discharge Planning: Goal: Ability to manage health-related needs will improve Outcome: Adequate for Discharge   Problem: Clinical Measurements: Goal: Ability to maintain clinical measurements within normal limits will improve Outcome: Adequate for Discharge Goal: Will remain free from infection Outcome: Adequate for Discharge Goal: Diagnostic test results will improve Outcome: Adequate for Discharge Goal: Respiratory complications will improve Outcome: Adequate for Discharge Goal: Cardiovascular complication will be avoided Outcome: Adequate for Discharge   Problem: Activity: Goal: Risk for activity intolerance will decrease Outcome: Adequate for Discharge   Problem: Nutrition: Goal: Adequate nutrition will be maintained Outcome: Adequate for Discharge   Problem: Coping: Goal: Level of anxiety will decrease Outcome: Adequate for Discharge   Problem: Elimination: Goal: Will not experience complications related to bowel motility Outcome: Adequate for Discharge Goal: Will not experience complications related to urinary retention Outcome: Adequate for Discharge   Problem: Pain Managment: Goal: General experience of comfort will improve and/or be controlled Outcome: Adequate for Discharge   Problem: Safety: Goal: Ability to remain free from injury will improve Outcome: Adequate for Discharge   Problem: Skin Integrity: Goal: Risk for impaired skin integrity will decrease Outcome: Adequate for Discharge   Problem: Acute Rehab OT Goals (only OT should resolve) Goal: Pt. Will Perform Grooming Outcome: Adequate for Discharge Goal: Pt. Will Perform Upper Body Dressing Outcome: Adequate for Discharge Goal: Pt. Will Perform  Lower Body Dressing Outcome: Adequate for Discharge Goal: Pt. Will Transfer To Toilet Outcome: Adequate for Discharge   Problem: Acute Rehab PT Goals(only PT should resolve) Goal: Pt Will Go Supine/Side To Sit Outcome: Adequate for Discharge Goal: Pt Will Go Sit To Supine/Side Outcome: Adequate for Discharge Goal: Patient Will Transfer Sit To/From Stand Outcome: Adequate for Discharge Goal: Pt Will Transfer Bed To Chair/Chair To Bed Outcome: Adequate for Discharge Goal: Pt Will Ambulate Outcome: Adequate for Discharge Goal: Pt Will Go Up/Down Stairs Outcome: Adequate for Discharge
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
## 2023-10-16 ENCOUNTER — Emergency Department (HOSPITAL_COMMUNITY)
Admission: EM | Admit: 2023-10-16 | Discharge: 2023-10-16 | Disposition: A | Discharge status: Home / Self Care | Attending: Emergency Medicine | Admitting: Emergency Medicine

## 2023-10-16 ENCOUNTER — Encounter (HOSPITAL_COMMUNITY): Payer: Self-pay

## 2023-10-16 ENCOUNTER — Emergency Department (HOSPITAL_COMMUNITY)

## 2023-10-16 ENCOUNTER — Other Ambulatory Visit: Payer: Self-pay

## 2023-10-16 DIAGNOSIS — Z4803 Encounter for change or removal of drains: Secondary | ICD-10-CM | POA: Diagnosis present
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
## 2023-10-16 NOTE — Discharge Instructions (Addendum)
 Thank you for allowing us  to care for you today.  We are able to remove your JP drain after talking to your general surgeon.  Please be sure to follow-up with them in the outpatient setting.  Please return to the emergency department if you experience nausea, vomiting, abdominal pain, chest pain, shortness of breath, passout or believe you need emergent medical care
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
